# Patient Record
Sex: Female | Born: 1970 | Race: Black or African American | Hispanic: No | Marital: Married | Smoking: Never smoker
Health system: Southern US, Community
[De-identification: ages and names within clinical notes are randomized; demographics above are authoritative.]

## PROBLEM LIST (undated history)

## (undated) DIAGNOSIS — O139 Gestational [pregnancy-induced] hypertension without significant proteinuria, unspecified trimester: Secondary | ICD-10-CM

## (undated) DIAGNOSIS — D649 Anemia, unspecified: Secondary | ICD-10-CM

## (undated) DIAGNOSIS — N739 Female pelvic inflammatory disease, unspecified: Secondary | ICD-10-CM

## (undated) DIAGNOSIS — O24419 Gestational diabetes mellitus in pregnancy, unspecified control: Secondary | ICD-10-CM

## (undated) HISTORY — PX: NO PAST SURGERIES: SHX2092

## (undated) HISTORY — DX: Anemia, unspecified: D64.9

## (undated) HISTORY — PX: ABDOMINAL HYSTERECTOMY: SHX81

---

## 2001-04-13 ENCOUNTER — Other Ambulatory Visit: Admission: RE | Admit: 2001-04-13 | Discharge: 2001-04-13 | Payer: Self-pay | Admitting: Obstetrics and Gynecology

## 2001-07-30 ENCOUNTER — Inpatient Hospital Stay (HOSPITAL_COMMUNITY): Admission: AD | Admit: 2001-07-30 | Discharge: 2001-07-30 | Payer: Self-pay | Admitting: Obstetrics and Gynecology

## 2001-11-14 ENCOUNTER — Inpatient Hospital Stay (HOSPITAL_COMMUNITY): Admission: AD | Admit: 2001-11-14 | Discharge: 2001-11-16 | Payer: Self-pay | Admitting: Obstetrics and Gynecology

## 2004-06-07 ENCOUNTER — Inpatient Hospital Stay (HOSPITAL_COMMUNITY): Admission: AD | Admit: 2004-06-07 | Discharge: 2004-06-07 | Payer: Self-pay | Admitting: Obstetrics & Gynecology

## 2004-10-12 ENCOUNTER — Inpatient Hospital Stay (HOSPITAL_COMMUNITY): Admission: AD | Admit: 2004-10-12 | Discharge: 2004-10-14 | Payer: Self-pay | Admitting: Obstetrics & Gynecology

## 2006-03-01 ENCOUNTER — Emergency Department (HOSPITAL_COMMUNITY): Admission: EM | Admit: 2006-03-01 | Discharge: 2006-03-01 | Payer: Self-pay | Admitting: Emergency Medicine

## 2006-12-08 ENCOUNTER — Ambulatory Visit: Payer: Self-pay | Admitting: Internal Medicine

## 2006-12-09 ENCOUNTER — Ambulatory Visit: Payer: Self-pay | Admitting: *Deleted

## 2007-11-16 ENCOUNTER — Ambulatory Visit: Payer: Self-pay | Admitting: Nurse Practitioner

## 2007-11-17 ENCOUNTER — Encounter (INDEPENDENT_AMBULATORY_CARE_PROVIDER_SITE_OTHER): Payer: Self-pay | Admitting: Nurse Practitioner

## 2007-11-17 ENCOUNTER — Ambulatory Visit (HOSPITAL_COMMUNITY): Admission: RE | Admit: 2007-11-17 | Discharge: 2007-11-17 | Payer: Self-pay | Admitting: Internal Medicine

## 2007-11-17 LAB — CONVERTED CEMR LAB
ALT: 10 units/L (ref 0–35)
AST: 17 units/L (ref 0–37)
Albumin: 4 g/dL (ref 3.5–5.2)
Alkaline Phosphatase: 78 units/L (ref 39–117)
Basophils Absolute: 0 10*3/uL (ref 0.0–0.1)
Basophils Relative: 0 % (ref 0–1)
Eosinophils Absolute: 0 10*3/uL (ref 0.0–0.7)
Eosinophils Relative: 0 % (ref 0–5)
Glucose, Bld: 88 mg/dL (ref 70–99)
HCT: 26.6 % — ABNORMAL LOW (ref 36.0–46.0)
Lymphs Abs: 1.7 10*3/uL (ref 0.7–4.0)
MCV: 68 fL — ABNORMAL LOW (ref 78.0–100.0)
Neutrophils Relative %: 66 % (ref 43–77)
Platelets: 293 10*3/uL (ref 150–400)
Potassium: 3.8 meq/L (ref 3.5–5.3)
RDW: 20.5 % — ABNORMAL HIGH (ref 11.5–15.5)
Sodium: 141 meq/L (ref 135–145)
Total Bilirubin: 0.4 mg/dL (ref 0.3–1.2)
Total Protein: 7.3 g/dL (ref 6.0–8.3)
WBC: 7 10*3/uL (ref 4.0–10.5)

## 2007-12-15 ENCOUNTER — Ambulatory Visit: Payer: Self-pay | Admitting: Nurse Practitioner

## 2007-12-15 DIAGNOSIS — K644 Residual hemorrhoidal skin tags: Secondary | ICD-10-CM | POA: Insufficient documentation

## 2007-12-15 LAB — CONVERTED CEMR LAB
Bilirubin Urine: NEGATIVE
KOH Prep: NEGATIVE
Nitrite: NEGATIVE
Protein, U semiquant: 30
Urobilinogen, UA: 0.2
WBC Urine, dipstick: NEGATIVE

## 2007-12-16 ENCOUNTER — Encounter (INDEPENDENT_AMBULATORY_CARE_PROVIDER_SITE_OTHER): Payer: Self-pay | Admitting: Family Medicine

## 2007-12-18 DIAGNOSIS — D649 Anemia, unspecified: Secondary | ICD-10-CM

## 2007-12-18 LAB — CONVERTED CEMR LAB
Albumin: 4 g/dL (ref 3.5–5.2)
Alkaline Phosphatase: 90 units/L (ref 39–117)
BUN: 11 mg/dL (ref 6–23)
Calcium: 8.8 mg/dL (ref 8.4–10.5)
Chloride: 105 meq/L (ref 96–112)
Creatinine, Ser: 0.61 mg/dL (ref 0.40–1.20)
Eosinophils Absolute: 0.1 10*3/uL (ref 0.0–0.7)
Glucose, Bld: 75 mg/dL (ref 70–99)
HDL: 60 mg/dL (ref >39)
Hemoglobin: 7 g/dL — CL (ref 12.0–15.0)
Lymphs Abs: 2.3 10*3/uL (ref 0.7–4.0)
MCHC: 27.2 g/dL — ABNORMAL LOW (ref 30.0–36.0)
MCV: 67.1 fL — ABNORMAL LOW (ref 78.0–100.0)
Monocytes Absolute: 0.3 10*3/uL (ref 0.1–1.0)
Monocytes Relative: 6 % (ref 3–12)
Neutrophils Relative %: 47 % (ref 43–77)
Potassium: 4 meq/L (ref 3.5–5.3)
RBC: 3.83 M/uL — ABNORMAL LOW (ref 3.87–5.11)
Total CHOL/HDL Ratio: 2.6
Triglycerides: 49 mg/dL (ref <150)
WBC: 5.2 10*3/uL (ref 4.0–10.5)

## 2007-12-19 ENCOUNTER — Encounter (INDEPENDENT_AMBULATORY_CARE_PROVIDER_SITE_OTHER): Payer: Self-pay | Admitting: Nurse Practitioner

## 2007-12-19 LAB — CONVERTED CEMR LAB
Iron: 30 ug/dL — ABNORMAL LOW (ref 42–145)
Saturation Ratios: 7 % — ABNORMAL LOW (ref 20–55)
UIBC: 410 ug/dL

## 2007-12-21 ENCOUNTER — Ambulatory Visit (HOSPITAL_COMMUNITY): Admission: RE | Admit: 2007-12-21 | Discharge: 2007-12-21 | Payer: Self-pay | Admitting: Internal Medicine

## 2007-12-21 DIAGNOSIS — N83209 Unspecified ovarian cyst, unspecified side: Secondary | ICD-10-CM

## 2008-01-09 ENCOUNTER — Ambulatory Visit: Payer: Self-pay | Admitting: Nurse Practitioner

## 2008-01-10 ENCOUNTER — Encounter (HOSPITAL_COMMUNITY): Admission: RE | Admit: 2008-01-10 | Discharge: 2008-04-09 | Payer: Self-pay | Admitting: Family Medicine

## 2008-01-10 ENCOUNTER — Encounter (INDEPENDENT_AMBULATORY_CARE_PROVIDER_SITE_OTHER): Payer: Self-pay | Admitting: Nurse Practitioner

## 2008-01-10 LAB — CONVERTED CEMR LAB
Basophils Absolute: 0 10*3/uL (ref 0.0–0.1)
Basophils Relative: 1 % (ref 0–1)
CA 125: 14.3 units/mL (ref 0.0–30.2)
Eosinophils Absolute: 0.1 10*3/uL (ref 0.0–0.7)
MCHC: 27.7 g/dL — ABNORMAL LOW (ref 30.0–36.0)
MCV: 67.2 fL — ABNORMAL LOW (ref 78.0–100.0)
Monocytes Relative: 8 % (ref 3–12)
Neutrophils Relative %: 45 % (ref 43–77)
Platelets: 328 10*3/uL (ref 150–400)
RDW: 20.5 % — ABNORMAL HIGH (ref 11.5–15.5)

## 2008-01-23 ENCOUNTER — Ambulatory Visit: Payer: Self-pay | Admitting: Nurse Practitioner

## 2008-01-24 ENCOUNTER — Encounter (INDEPENDENT_AMBULATORY_CARE_PROVIDER_SITE_OTHER): Payer: Self-pay | Admitting: Nurse Practitioner

## 2008-01-24 LAB — CONVERTED CEMR LAB
Eosinophils Absolute: 0.1 10*3/uL (ref 0.0–0.7)
Iron: 57 ug/dL (ref 42–145)
Lymphs Abs: 1.8 10*3/uL (ref 0.7–4.0)
MCV: 77.9 fL — ABNORMAL LOW (ref 78.0–100.0)
Neutro Abs: 4.1 10*3/uL (ref 1.7–7.7)
Neutrophils Relative %: 62 % (ref 43–77)
Platelets: 219 10*3/uL (ref 150–400)
WBC: 6.6 10*3/uL (ref 4.0–10.5)

## 2008-03-08 ENCOUNTER — Ambulatory Visit: Payer: Self-pay | Admitting: Nurse Practitioner

## 2008-03-26 ENCOUNTER — Ambulatory Visit (HOSPITAL_COMMUNITY): Admission: RE | Admit: 2008-03-26 | Discharge: 2008-03-26 | Payer: Self-pay | Admitting: Internal Medicine

## 2008-03-27 DIAGNOSIS — N7013 Chronic salpingitis and oophoritis: Secondary | ICD-10-CM | POA: Insufficient documentation

## 2008-04-25 ENCOUNTER — Ambulatory Visit: Payer: Self-pay | Admitting: Nurse Practitioner

## 2008-04-25 DIAGNOSIS — K5900 Constipation, unspecified: Secondary | ICD-10-CM | POA: Insufficient documentation

## 2008-04-25 LAB — CONVERTED CEMR LAB
HCT: 39.4 % (ref 36.0–46.0)
Platelets: 264 10*3/uL (ref 150–400)
Retic Ct Pct: 0.8 % (ref 0.4–3.1)
WBC: 4.6 10*3/uL (ref 4.0–10.5)

## 2008-04-26 ENCOUNTER — Encounter (INDEPENDENT_AMBULATORY_CARE_PROVIDER_SITE_OTHER): Payer: Self-pay | Admitting: Nurse Practitioner

## 2008-06-14 ENCOUNTER — Ambulatory Visit: Payer: Self-pay | Admitting: Internal Medicine

## 2008-06-14 DIAGNOSIS — J069 Acute upper respiratory infection, unspecified: Secondary | ICD-10-CM | POA: Insufficient documentation

## 2008-10-28 ENCOUNTER — Ambulatory Visit: Payer: Self-pay | Admitting: Nurse Practitioner

## 2008-10-28 DIAGNOSIS — J329 Chronic sinusitis, unspecified: Secondary | ICD-10-CM | POA: Insufficient documentation

## 2008-10-28 LAB — CONVERTED CEMR LAB
HCT: 39.1 % (ref 36.0–46.0)
Lymphocytes Relative: 34 % (ref 12–46)
Lymphs Abs: 2.5 10*3/uL (ref 0.7–4.0)
Neutrophils Relative %: 56 % (ref 43–77)
Platelets: 246 10*3/uL (ref 150–400)
Preg, Serum: POSITIVE
Rapid Strep: NEGATIVE
WBC: 7.3 10*3/uL (ref 4.0–10.5)

## 2008-10-29 ENCOUNTER — Encounter (INDEPENDENT_AMBULATORY_CARE_PROVIDER_SITE_OTHER): Payer: Self-pay | Admitting: Nurse Practitioner

## 2008-10-29 LAB — CONVERTED CEMR LAB: hCG, Beta Chain, Quant, S: 34622.9 milliintl units/mL

## 2008-11-28 ENCOUNTER — Emergency Department (HOSPITAL_COMMUNITY): Admission: EM | Admit: 2008-11-28 | Discharge: 2008-11-28 | Payer: Self-pay | Admitting: Emergency Medicine

## 2009-01-23 ENCOUNTER — Inpatient Hospital Stay (HOSPITAL_COMMUNITY): Admission: AD | Admit: 2009-01-23 | Discharge: 2009-01-23 | Payer: Self-pay | Admitting: Obstetrics & Gynecology

## 2009-04-15 ENCOUNTER — Encounter: Admission: RE | Admit: 2009-04-15 | Discharge: 2009-05-30 | Payer: Self-pay | Admitting: Obstetrics

## 2009-05-22 ENCOUNTER — Ambulatory Visit (HOSPITAL_COMMUNITY): Admission: RE | Admit: 2009-05-22 | Discharge: 2009-05-22 | Payer: Self-pay | Admitting: Obstetrics

## 2009-05-26 ENCOUNTER — Ambulatory Visit (HOSPITAL_COMMUNITY): Admission: RE | Admit: 2009-05-26 | Discharge: 2009-05-26 | Payer: Self-pay | Admitting: Obstetrics

## 2009-05-29 ENCOUNTER — Ambulatory Visit (HOSPITAL_COMMUNITY): Admission: RE | Admit: 2009-05-29 | Discharge: 2009-05-29 | Payer: Self-pay | Admitting: Obstetrics

## 2009-06-02 ENCOUNTER — Ambulatory Visit (HOSPITAL_COMMUNITY): Admission: RE | Admit: 2009-06-02 | Discharge: 2009-06-02 | Payer: Self-pay | Admitting: Obstetrics

## 2009-06-05 ENCOUNTER — Ambulatory Visit (HOSPITAL_COMMUNITY): Admission: RE | Admit: 2009-06-05 | Discharge: 2009-06-05 | Payer: Self-pay | Admitting: Obstetrics

## 2009-06-09 ENCOUNTER — Ambulatory Visit (HOSPITAL_COMMUNITY): Admission: RE | Admit: 2009-06-09 | Discharge: 2009-06-09 | Payer: Self-pay | Admitting: Obstetrics

## 2009-06-11 ENCOUNTER — Inpatient Hospital Stay (HOSPITAL_COMMUNITY): Admission: AD | Admit: 2009-06-11 | Discharge: 2009-06-13 | Payer: Self-pay | Admitting: Obstetrics & Gynecology

## 2009-06-13 ENCOUNTER — Inpatient Hospital Stay (HOSPITAL_COMMUNITY): Admission: AD | Admit: 2009-06-13 | Discharge: 2009-06-13 | Payer: Self-pay | Admitting: Obstetrics

## 2009-06-17 ENCOUNTER — Emergency Department (HOSPITAL_COMMUNITY): Admission: EM | Admit: 2009-06-17 | Discharge: 2009-06-17 | Payer: Self-pay | Admitting: Emergency Medicine

## 2009-06-19 ENCOUNTER — Emergency Department (HOSPITAL_COMMUNITY): Admission: EM | Admit: 2009-06-19 | Discharge: 2009-06-19 | Payer: Self-pay | Admitting: Emergency Medicine

## 2009-09-12 IMAGING — US US PELVIS COMPLETE MODIFY
1 series · 13 of 25 positions shown · non-contrast
Comparison: Correlation made with CT pelvis 11/17/2007

CLINICAL DATA: Abnormal CT pelvis with right adnexal mass, question
uterine fibroids

TRANSABDOMINAL AND TRANSVAGINAL ULTRASOUND OF PELVIS
TECHNIQUE: Both transabdominal and transvaginal ultrasound
examinations of the pelvis were performed including evaluation of
the uterus, ovaries, adnexal regions, and pelvic cul-de-sac.

[Series 1: unknown · 0.28mm/px · 13 of 60 slices shown]
[im 1/60]
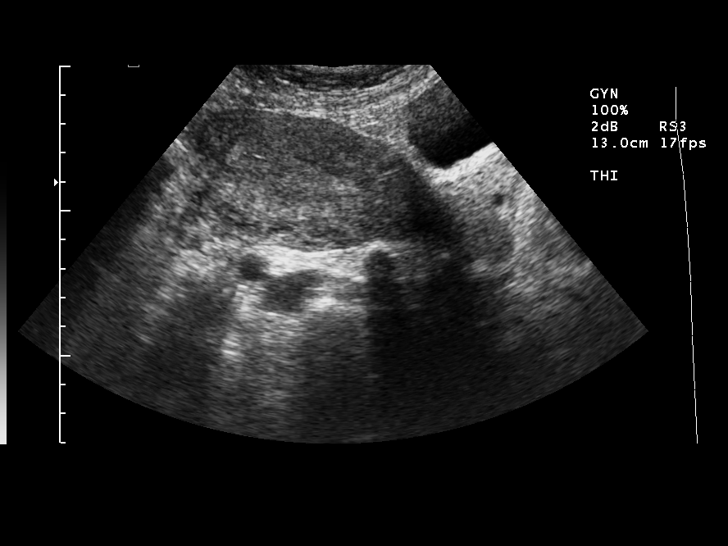
[im 5/60]
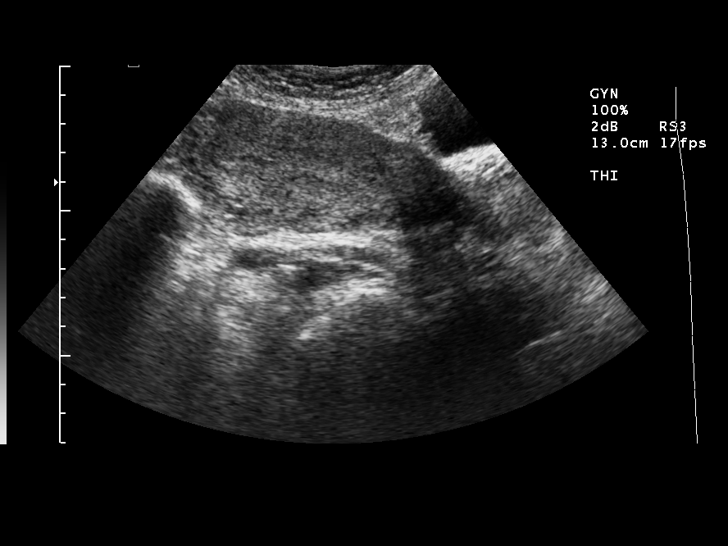
[im 10/60]
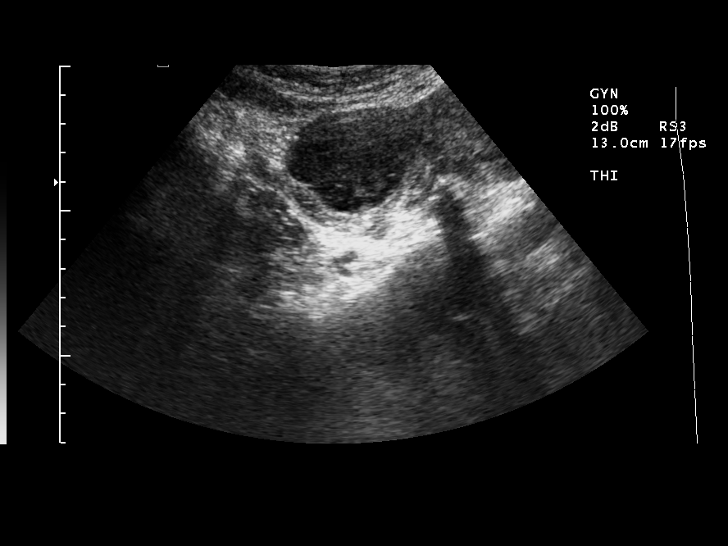
[im 15/60]
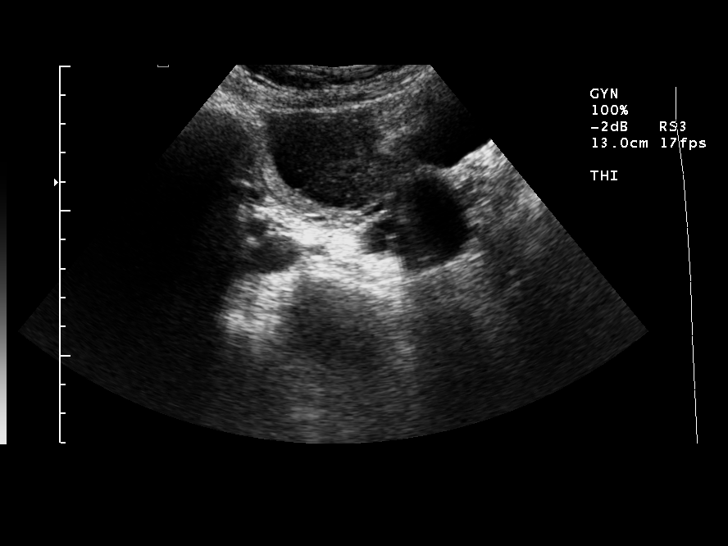
[im 20/60]
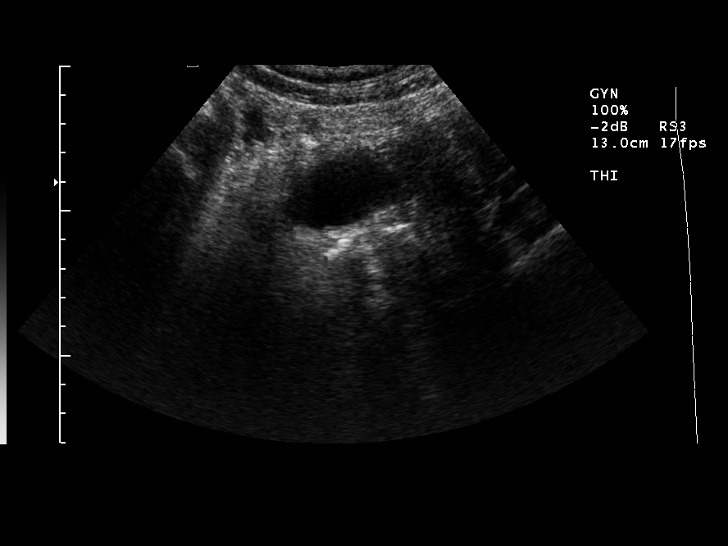
[im 25/60]
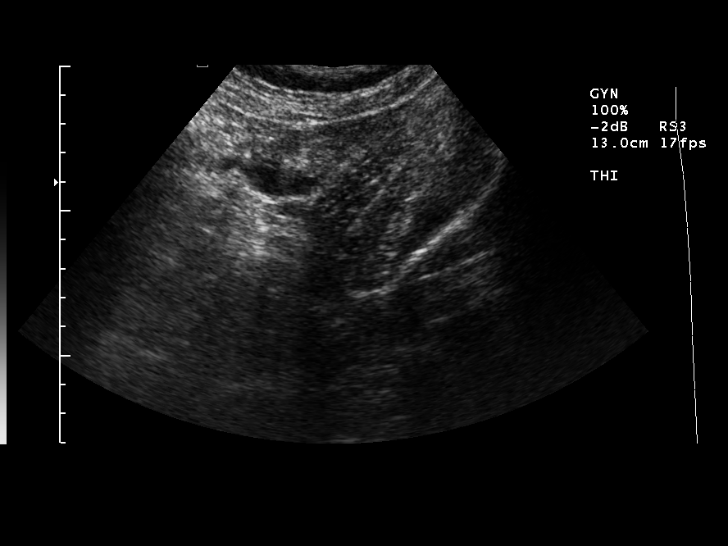
[im 30/60]
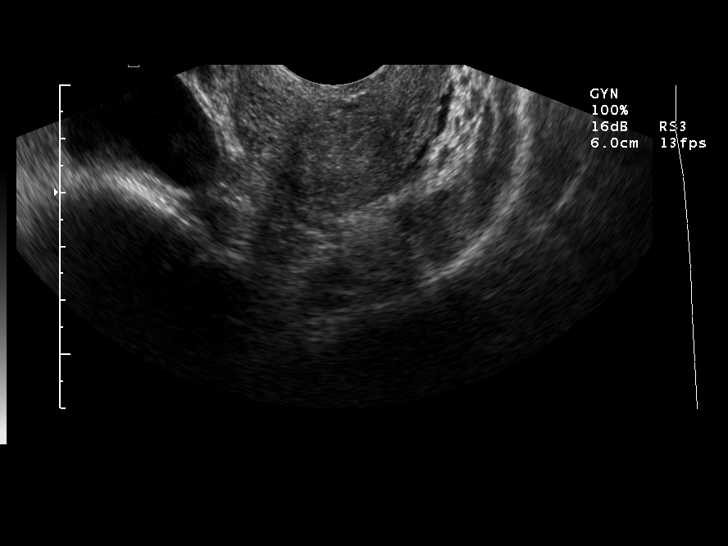
[im 35/60]
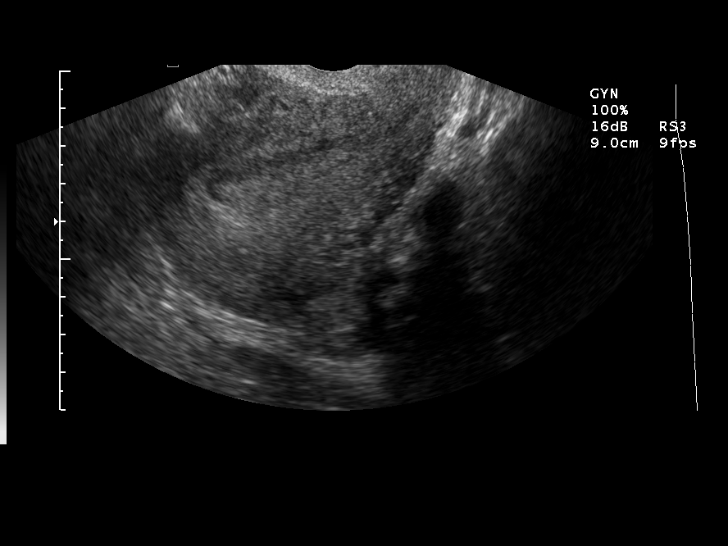
[im 40/60]
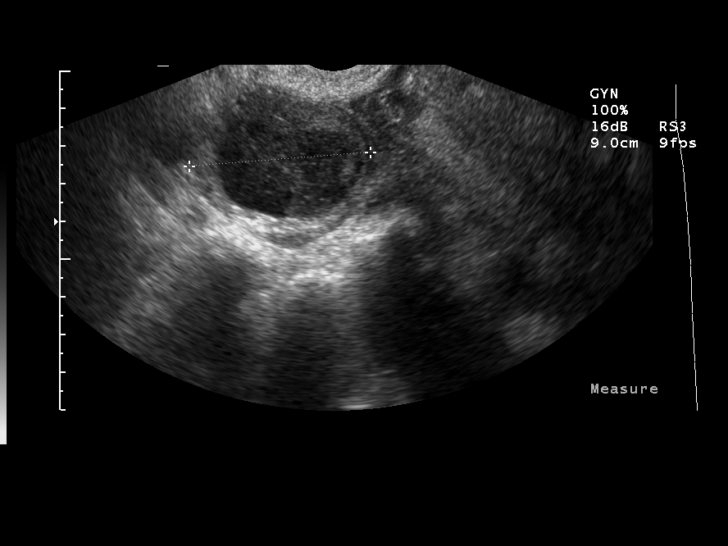
[im 45/60]
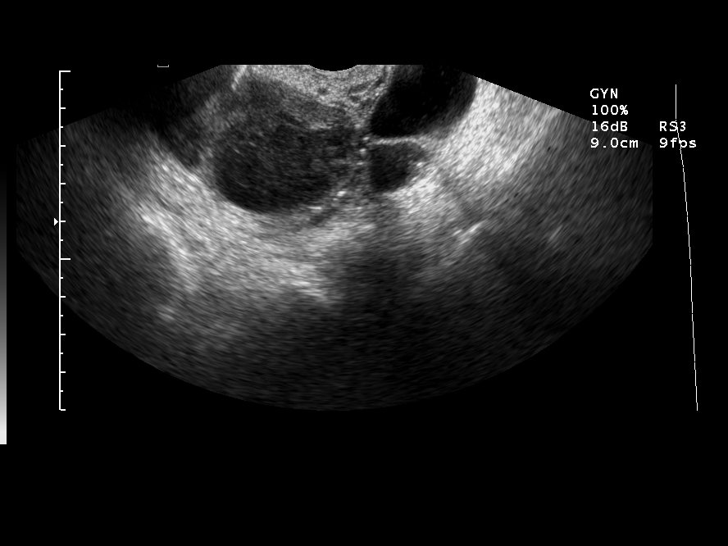
[im 50/60]
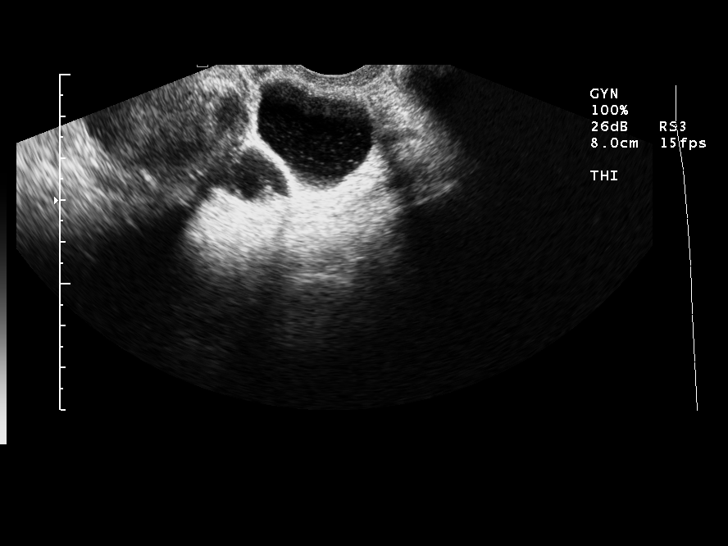
[im 55/60]
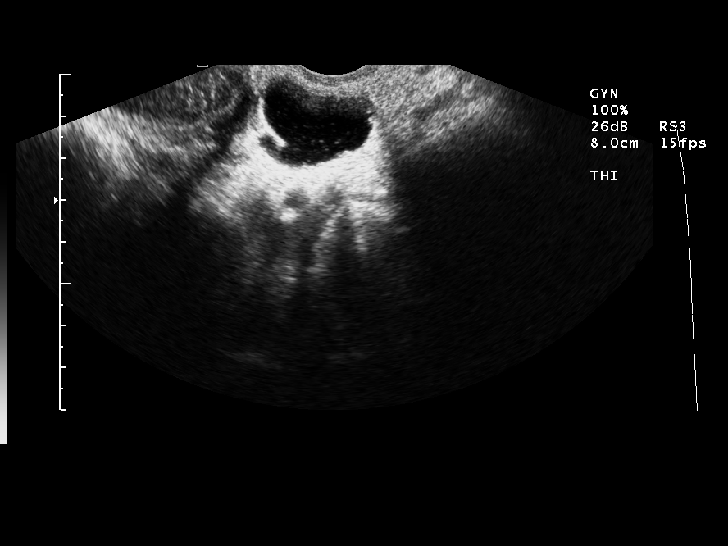
[im 60/60]
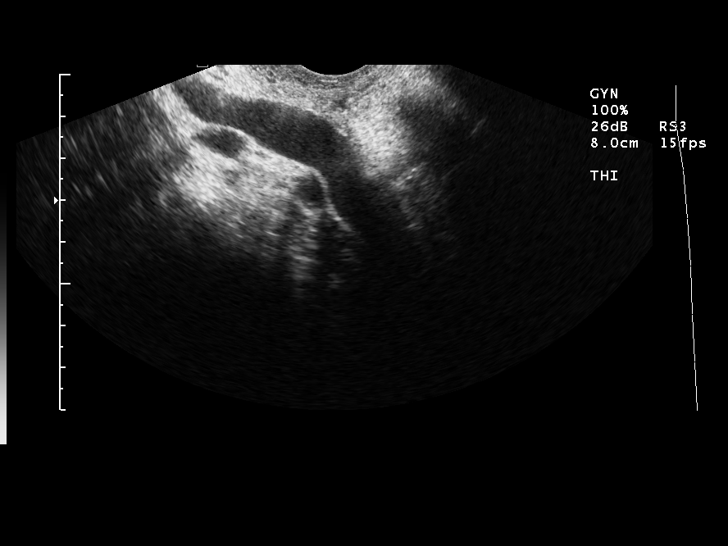

[13 of 25 positions shown; findings below may reference images not displayed]

FINDINGS: Uterus measures 10.1 cm length by 5.9 cm AP by 7.5 cm transverse.
Endometrial complex 12 mm thick, minimally prominent.
No definite uterine mass or endometrial fluid.
No free pelvic fluid.
Left ovary not visualized on either transabdominal nor endovaginal
imaging.
Tubular fluid collection containing minimal debris right adnexa
compatible with hydrosalpinx.
Right ovary measures 4.3 x 4.2 x 4.8 cm.
Within right ovary, separate from segment of hydrosalpinx and
different in imaging character, a hypoechoic nodule is identified
3.6 x 2.5 x 3.7 cm, containing heterogeneous low level
echogenicity, question hemorrhagic cyst.
No other pelvic masses identified.
IMPRESSION: Minimally prominent endometrial complex, nonspecific.
Nonvisualization left ovary.
Short segment of hydrosalpinx in right adnexa.
3.6 x 2.5 x 3.7 cm diameter complex hypoechoic mass right ovary
question hemorrhagic cyst, recommend follow-up ultrasound after two
menstrual cycles to reassess, in order to exclude ovarian tumor.

## 2010-10-05 ENCOUNTER — Encounter: Payer: Self-pay | Admitting: Obstetrics

## 2010-12-18 LAB — RAPID HIV SCREEN (WH-MAU): Rapid HIV Screen: NONREACTIVE

## 2010-12-18 LAB — CBC
HCT: 30.9 % — ABNORMAL LOW (ref 36.0–46.0)
MCHC: 32.8 g/dL (ref 30.0–36.0)
MCV: 91.4 fL (ref 78.0–100.0)
Platelets: 184 10*3/uL (ref 150–400)
RBC: 4.11 MIL/uL (ref 3.87–5.11)
RDW: 15.1 % (ref 11.5–15.5)
WBC: 6.9 10*3/uL (ref 4.0–10.5)

## 2010-12-18 LAB — GLUCOSE, RANDOM: Glucose, Bld: 112 mg/dL — ABNORMAL HIGH (ref 70–99)

## 2010-12-18 LAB — RPR: RPR Ser Ql: NONREACTIVE

## 2010-12-22 LAB — DIFFERENTIAL
Basophils Absolute: 0.1 10*3/uL (ref 0.0–0.1)
Basophils Relative: 1 % (ref 0–1)
Eosinophils Absolute: 0 10*3/uL (ref 0.0–0.7)
Neutro Abs: 5 10*3/uL (ref 1.7–7.7)
Neutrophils Relative %: 64 % (ref 43–77)

## 2010-12-22 LAB — CBC
MCHC: 33.8 g/dL (ref 30.0–36.0)
MCV: 89.6 fL (ref 78.0–100.0)
Platelets: 199 10*3/uL (ref 150–400)
RDW: 15.4 % (ref 11.5–15.5)

## 2010-12-22 LAB — URINALYSIS, ROUTINE W REFLEX MICROSCOPIC
Bilirubin Urine: NEGATIVE
Hgb urine dipstick: NEGATIVE
Ketones, ur: NEGATIVE mg/dL
Nitrite: NEGATIVE
Protein, ur: NEGATIVE mg/dL
Specific Gravity, Urine: 1.01 (ref 1.005–1.030)
Urobilinogen, UA: 0.2 mg/dL (ref 0.0–1.0)

## 2010-12-24 LAB — URINALYSIS, ROUTINE W REFLEX MICROSCOPIC
Hgb urine dipstick: NEGATIVE
Protein, ur: NEGATIVE mg/dL
Specific Gravity, Urine: 1.019 (ref 1.005–1.030)
Urobilinogen, UA: 1 mg/dL (ref 0.0–1.0)

## 2010-12-24 LAB — CBC
HCT: 38.2 % (ref 36.0–46.0)
Hemoglobin: 12.6 g/dL (ref 12.0–15.0)
MCHC: 33.1 g/dL (ref 30.0–36.0)
RBC: 4.32 MIL/uL (ref 3.87–5.11)
RDW: 16.5 % — ABNORMAL HIGH (ref 11.5–15.5)

## 2010-12-24 LAB — POCT I-STAT, CHEM 8
BUN: 6 mg/dL (ref 6–23)
Creatinine, Ser: 0.7 mg/dL (ref 0.4–1.2)
Glucose, Bld: 99 mg/dL (ref 70–99)
Hemoglobin: 13.9 g/dL (ref 12.0–15.0)
Potassium: 3.7 mEq/L (ref 3.5–5.1)
Sodium: 136 mEq/L (ref 135–145)
TCO2: 23 mmol/L (ref 0–100)

## 2010-12-24 LAB — URINE CULTURE

## 2010-12-24 LAB — GC/CHLAMYDIA PROBE AMP, GENITAL
Chlamydia, DNA Probe: NEGATIVE
GC Probe Amp, Genital: NEGATIVE

## 2010-12-24 LAB — POCT CARDIAC MARKERS
CKMB, poc: <1 ng/mL — ABNORMAL LOW (ref 1.0–8.0)
Myoglobin, poc: 36.7 ng/mL (ref 12–200)

## 2011-06-08 LAB — CROSSMATCH

## 2011-06-08 LAB — ABO/RH: ABO/RH(D): A POS

## 2013-07-17 ENCOUNTER — Observation Stay (HOSPITAL_COMMUNITY): Payer: No Typology Code available for payment source

## 2013-07-17 ENCOUNTER — Emergency Department (HOSPITAL_COMMUNITY): Payer: No Typology Code available for payment source

## 2013-07-17 ENCOUNTER — Encounter (HOSPITAL_COMMUNITY): Payer: Self-pay | Admitting: Emergency Medicine

## 2013-07-17 ENCOUNTER — Observation Stay (HOSPITAL_COMMUNITY)
Admission: EM | Admit: 2013-07-17 | Discharge: 2013-07-19 | Disposition: A | Discharge status: Home / Self Care | Payer: No Typology Code available for payment source | Attending: Internal Medicine | Admitting: Internal Medicine

## 2013-07-17 DIAGNOSIS — R0602 Shortness of breath: Secondary | ICD-10-CM | POA: Insufficient documentation

## 2013-07-17 DIAGNOSIS — N83209 Unspecified ovarian cyst, unspecified side: Secondary | ICD-10-CM

## 2013-07-17 DIAGNOSIS — N7013 Chronic salpingitis and oophoritis: Secondary | ICD-10-CM

## 2013-07-17 DIAGNOSIS — R0989 Other specified symptoms and signs involving the circulatory and respiratory systems: Principal | ICD-10-CM | POA: Insufficient documentation

## 2013-07-17 DIAGNOSIS — L301 Dyshidrosis [pompholyx]: Secondary | ICD-10-CM | POA: Insufficient documentation

## 2013-07-17 DIAGNOSIS — R0789 Other chest pain: Secondary | ICD-10-CM | POA: Insufficient documentation

## 2013-07-17 DIAGNOSIS — Z3202 Encounter for pregnancy test, result negative: Secondary | ICD-10-CM | POA: Insufficient documentation

## 2013-07-17 DIAGNOSIS — R079 Chest pain, unspecified: Secondary | ICD-10-CM | POA: Diagnosis present

## 2013-07-17 DIAGNOSIS — D649 Anemia, unspecified: Secondary | ICD-10-CM | POA: Diagnosis present

## 2013-07-17 DIAGNOSIS — K644 Residual hemorrhoidal skin tags: Secondary | ICD-10-CM

## 2013-07-17 DIAGNOSIS — J329 Chronic sinusitis, unspecified: Secondary | ICD-10-CM

## 2013-07-17 DIAGNOSIS — K5900 Constipation, unspecified: Secondary | ICD-10-CM

## 2013-07-17 DIAGNOSIS — J069 Acute upper respiratory infection, unspecified: Secondary | ICD-10-CM

## 2013-07-17 DIAGNOSIS — R0609 Other forms of dyspnea: Principal | ICD-10-CM | POA: Insufficient documentation

## 2013-07-17 DIAGNOSIS — R51 Headache: Secondary | ICD-10-CM | POA: Insufficient documentation

## 2013-07-17 LAB — CBC
HCT: 28.1 % — ABNORMAL LOW (ref 36.0–46.0)
Hemoglobin: 8.5 g/dL — ABNORMAL LOW (ref 12.0–15.0)
MCV: 68.9 fL — ABNORMAL LOW (ref 78.0–100.0)
RBC: 4.08 MIL/uL (ref 3.87–5.11)
RDW: 18.8 % — ABNORMAL HIGH (ref 11.5–15.5)
WBC: 6.8 10*3/uL (ref 4.0–10.5)

## 2013-07-17 LAB — POCT I-STAT TROPONIN I

## 2013-07-17 LAB — URINE MICROSCOPIC-ADD ON

## 2013-07-17 LAB — TYPE AND SCREEN
ABO/RH(D): A POS
Antibody Screen: NEGATIVE

## 2013-07-17 LAB — BASIC METABOLIC PANEL
BUN: 13 mg/dL (ref 6–23)
CO2: 22 mEq/L (ref 19–32)
Chloride: 106 mEq/L (ref 96–112)
GFR calc Af Amer: >90 mL/min (ref >90)
Potassium: 3.7 mEq/L (ref 3.5–5.1)

## 2013-07-17 LAB — URINALYSIS, ROUTINE W REFLEX MICROSCOPIC
Bilirubin Urine: NEGATIVE
Glucose, UA: NEGATIVE mg/dL
Leukocytes, UA: NEGATIVE
Nitrite: NEGATIVE
Specific Gravity, Urine: 1.028 (ref 1.005–1.030)
pH: 7 (ref 5.0–8.0)

## 2013-07-17 LAB — POCT PREGNANCY, URINE: Preg Test, Ur: NEGATIVE

## 2013-07-17 MED ORDER — ACETAMINOPHEN 325 MG PO TABS: 650.0000 mg | ORAL_TABLET | Freq: Four times a day (QID) | ORAL | Status: DC | PRN

## 2013-07-17 MED ORDER — KETOROLAC TROMETHAMINE 30 MG/ML IJ SOLN
30.0000 mg | Freq: Once | INTRAMUSCULAR | Status: AC
  Administered 2013-07-17: 30 mg via INTRAVENOUS
  Filled 2013-07-17: qty 1

## 2013-07-17 MED ORDER — METOCLOPRAMIDE HCL 5 MG/ML IJ SOLN
10.0000 mg | Freq: Once | INTRAMUSCULAR | Status: AC
  Administered 2013-07-17: 10 mg via INTRAVENOUS
  Filled 2013-07-17: qty 2

## 2013-07-17 MED ORDER — SODIUM CHLORIDE 0.9 % IJ SOLN
3.0000 mL | Freq: Two times a day (BID) | INTRAMUSCULAR | Status: DC
  Administered 2013-07-18: 3 mL via INTRAVENOUS

## 2013-07-17 MED ORDER — SODIUM CHLORIDE 0.9 % IJ SOLN
3.0000 mL | Freq: Two times a day (BID) | INTRAMUSCULAR | Status: DC
  Administered 2013-07-18 – 2013-07-19 (×2): 3 mL via INTRAVENOUS

## 2013-07-17 MED ORDER — ONDANSETRON HCL 4 MG PO TABS: 4.0000 mg | ORAL_TABLET | Freq: Four times a day (QID) | ORAL | Status: DC | PRN

## 2013-07-17 MED ORDER — ONDANSETRON HCL 4 MG/2ML IJ SOLN: 4.0000 mg | Freq: Three times a day (TID) | INTRAMUSCULAR | Status: DC | PRN

## 2013-07-17 MED ORDER — ACETAMINOPHEN 650 MG RE SUPP: 650.0000 mg | Freq: Four times a day (QID) | RECTAL | Status: DC | PRN

## 2013-07-17 MED ORDER — ONDANSETRON HCL 4 MG/2ML IJ SOLN: 4.0000 mg | Freq: Four times a day (QID) | INTRAMUSCULAR | Status: DC | PRN

## 2013-07-17 NOTE — ED Notes (Signed)
Family at bedside. 

## 2013-07-17 NOTE — ED Notes (Signed)
Report to [place] on 3W.  [facility] RN to take pt to CT then take patient to the floor.

## 2013-07-17 NOTE — ED Notes (Addendum)
Pt is here for body aches for 3 weeks.  Pt states that she has body aches and Headaches.  She states that it feels like "someone beat me" on her whole body.  Pt states that she has had some dizziness and that she feels tired and sob and CP with any activity.  Pt denies any cough or cold.  Pt feels like she has fever and sweats at night.  Pt denies any coughing up of blood or weight loss.  No one has been sick around her

## 2013-07-17 NOTE — ED Notes (Signed)
Pt moved to room 28 to await floor/room assignment. Pt alert, denies pain.  Placed on monitor, SR without ectopy.  Pt denies needs at this time.

## 2013-07-17 NOTE — ED Provider Notes (Signed)
CSN: [id number]     Arrival date & time 07/17/13  1742 History   First MD Initiated Contact with Patient 07/17/13 1923     Chief Complaint  Patient presents with  . Illness   (Consider location/radiation/quality/duration/timing/severity/associated sxs/prior Treatment) HPI Comments: Pt comes in with cc of body aches, headaches, chest pain, dib and weakness. Her sx have been present for 3 weeks now. She has no medical hx. Reports that she has frontal headaches, mostly constant and moderate, with intermittent relief with tylenol. No nausea, vomiting, visual complains, seizures, altered mental status, loss of consciousness, new weakness, or numbness, no gait instability. Pt also has been having some chest pain - mid sternal, non exerting which is worse with exertion and with associated dyspnea. Pt has been having some night sweats, no weight loss. She is from [place], not been there is a while, and denies any cough.   Patient is a 42 y.o. female presenting with general illness. The history is provided by the patient.  Illness Associated symptoms: fatigue, headaches and shortness of breath   Associated symptoms: no abdominal pain, no chest pain, no cough, no diarrhea, no nausea, no vomiting and no wheezing     History reviewed. No pertinent past medical history. History reviewed. No pertinent past surgical history. Family History  Problem Relation Age of Onset  . Other Neg Hx    History  Substance Use Topics  . Smoking status: Never Smoker   . Smokeless tobacco: Not on file  . Alcohol Use: No   OB History   Grav Para Term Preterm Abortions TAB SAB Ect Mult Living                 Review of Systems  Constitutional: Positive for diaphoresis and fatigue. Negative for activity change.  HENT: Negative for facial swelling.   Respiratory: Positive for chest tightness and shortness of breath. Negative for cough and wheezing.   Cardiovascular: Negative for chest pain.  Gastrointestinal:  Negative for nausea, vomiting, abdominal pain, diarrhea, constipation, blood in stool and abdominal distention.  Genitourinary: Negative for hematuria and difficulty urinating.  Musculoskeletal: Negative for neck pain.  Skin: Negative for color change.  Neurological: Positive for headaches. Negative for speech difficulty.  Hematological: Does not bruise/bleed easily.  Psychiatric/Behavioral: Negative for confusion.    Allergies  Review of patient's allergies indicates no known allergies.  Home Medications   Current Outpatient Rx  Name  Route  Sig  Dispense  Refill  . acetaminophen (TYLENOL) 500 MG tablet   Oral   Take 500 mg by mouth every 6 (six) hours as needed.          BP 148/88  Pulse 77  Temp(Src) 98.2 F (36.8 C) (Rectal)  Resp 24  Ht 5\' 7"  (1.702 m)  Wt 207 lb (93.895 kg)  BMI 32.41 kg/m2  SpO2 99%  LMP 07/17/2013 Physical Exam  Constitutional: She is oriented to person, place, and time. She appears well-developed and well-nourished.  HENT:  Head: Normocephalic and atraumatic.  Eyes: EOM are normal. Pupils are equal, round, and reactive to light.  Neck: Neck supple.  Cardiovascular: Normal rate, regular rhythm and normal heart sounds.   No murmur heard. Pulmonary/Chest: Effort normal. No respiratory distress. She has no wheezes. She has no rales.  Abdominal: Soft. She exhibits no distension. There is no tenderness. There is no rebound and no guarding.  Neurological: She is alert and oriented to person, place, and time. No cranial nerve deficit. Coordination normal.  Skin: Skin is warm and dry.    ED Course  Procedures (including critical care time) Labs Review Labs Reviewed  CBC - Abnormal; Notable for the following:    Hemoglobin 8.5 (*)    HCT 28.1 (*)    MCV 68.9 (*)    MCH 20.8 (*)    RDW 18.8 (*)    All other components within normal limits  URINALYSIS, ROUTINE W REFLEX MICROSCOPIC - Abnormal; Notable for the following:    APPearance CLOUDY (*)     Hgb urine dipstick MODERATE (*)    All other components within normal limits  URINE MICROSCOPIC-ADD ON - Abnormal; Notable for the following:    Squamous Epithelial / LPF FEW (*)    Bacteria, UA FEW (*)    All other components within normal limits  BASIC METABOLIC PANEL  POCT I-STAT TROPONIN I  POCT PREGNANCY, URINE  TYPE AND SCREEN   Imaging Review Dg Chest 2 View  07/17/2013   CLINICAL DATA:  Chest pain and fever  EXAM: CHEST  2 VIEW  COMPARISON:  None.  FINDINGS: Lungs are clear. Heart size and pulmonary vascularity are normal. No adenopathy. No pneumothorax. 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  IMPRESSION: No edema or consolidation.   Electronically Signed   By: Bretta Bang M.D.   On: 07/17/2013 20:31    EKG Interpretation     Ventricular Rate:  65 PR Interval:  188 QRS Duration: 80 QT Interval:  414 QTC Calculation: 430 R Axis:   19 Text Interpretation:  Normal sinus rhythm with sinus arrhythmia Normal ECG            MDM   1. Dyspnea on exertion   2. Anemia    Pt comes in with multiple complains. The headaches are intermittent, and present for several weeks now. No concerns for any life threatening cause of the headaches, she has no neuro deficits and risk factors for aneurysms or bleed.  Pt has chest pain, and dib - both exertional. She has slightly low Hb - denies bloody stools. No cardiac risk factors at all. She is slightly tachypneic for me, RR in the low 20s. Her WELLS score is 0 and she is PERC neg.  i believe PE and ACS are highly unlikely, but with exertional dyspnea and chest pain - she needs further cardiac evaluation for sure, may be an echo to start.  Also, don't think this is symptomatic anemia. We will get T&S, and get serial Hb while admitted.  Not sure what the night sweats are all about. ? Paraneoplastic, She has no cough, weight loss.    Derwood Kaplan, MD 07/17/13 2316

## 2013-07-17 NOTE — H&P (Signed)
[facility] History and Physical  Loralee Weitzman ZOX:[id number] DOB: 06-14-1971 DOA: 07/17/2013  Referring physician: ER physician. PCP: No PCP Per Patient   Chief Complaint: Chest pain and short of breath.  HPI: Kristin [name] is a 42 y.o. female no significant past medical history presented to the ER because of chest pain and shortness of breath. Patient states that over the last 3-4 days patient has been having increasing exertional chest pain with shortness of breath. Patient states that with minimal exertion patient was easily getting short of breath. Denies any fever chills productive cough. Patient has been having frontal headache for last few weeks and has been taking Tylenol. Patient has chronic anemia and denies any GI bleed. In the ER cardiac markers and EKG were unremarkable and patient has been limited for further observation.   Review of Systems: As presented in the history of presenting illness, rest negative.  History reviewed. No pertinent past medical history. History reviewed. No pertinent past surgical history. Social History:  reports that she has never smoked. She does not have any smokeless tobacco history on file. She reports that she does not drink alcohol. Her drug history is not on file. Where does patient live  home.  Can patient participate in ADLs? yes.   No Known Allergies  Family History:  Family History  Problem Relation Age of Onset  . Other Neg Hx       Prior to Admission medications   Medication Sig Start Date End Date Taking? Authorizing Provider  acetaminophen (TYLENOL) 500 MG tablet Take 500 mg by mouth every 6 (six) hours as needed.   Yes Historical Provider, MD    Physical Exam: Filed Vitals:   07/17/13 2130 07/17/13 2133 07/17/13 2145 07/17/13 2200  BP: 157/96  136/75 148/88  Pulse: 75  67 77  Temp:  98.2 F (36.8 C)    TempSrc:  Rectal    Resp:      Height:      Weight:      SpO2: 100%  99% 99%     General:   Well-developed  well-nourished.  Eyes: Anicteric no pallor.  ENT:  No discharge from the ears eyes nose mouth.  Neck:  no mass felt.   Cardiovascular:  S1-S2 heard.   Respiratory:  no rhonchi or crepitations.  Abdomen:  Soft nontender bowel sounds present.  Skin:  no rash.   Musculoskeletal:  no edema.   Psychiatric:  appears normal.   Neurologic:  alert awake oriented to time place and person. Moves all extremities.  Labs on Admission:  Basic Metabolic Panel:  Recent Labs Lab 07/17/13 1823  NA 140  K 3.7  CL 106  CO2 22  GLUCOSE 95  BUN 13  CREATININE 0.69  CALCIUM 9.1   Liver Function Tests: No results found for this basename: AST, ALT, ALKPHOS, BILITOT, PROT, ALBUMIN,  in the last 168 hours No results found for this basename: LIPASE, AMYLASE,  in the last 168 hours No results found for this basename: AMMONIA,  in the last 168 hours CBC:  Recent Labs Lab 07/17/13 1823  WBC 6.8  HGB 8.5*  HCT 28.1*  MCV 68.9*  PLT 285   Cardiac Enzymes: No results found for this basename: CKTOTAL, CKMB, CKMBINDEX, TROPONINI,  in the last 168 hours  BNP (last 3 results) No results found for this basename: PROBNP,  in the last 8760 hours CBG: No results found for this basename: GLUCAP,  in the last 168 hours  Radiological  Exams on Admission: Dg Chest 2 View  07/17/2013   CLINICAL DATA:  Chest pain and fever  EXAM: CHEST  2 VIEW  COMPARISON:  None.  FINDINGS: Lungs are clear. Heart size and pulmonary vascularity are normal. No adenopathy. No pneumothorax. 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  IMPRESSION: No edema or consolidation.   Electronically Signed   By: Bretta Bang M.D.   On: 07/17/2013 20:31    EKG: Independently reviewed.  normal sinus rhythm.   Assessment/Plan Principal Problem:   Chest pain Active Problems:   ANEMIA   1. Shortness of breath and chest pain with exertion  - cycle cardiac markers. Check d-dimer. 2-D echo.  2. Anemia  - check stool for occult  blood. Anemia panel. Closely follow CBC.  3. Headache  - check CT head.     Code Status:  full code.   Family Communication:  none.   Disposition Plan:  admit for observation.     Berneta Sconyers N. [facility] Pager [phone].   If 7PM-7AM, please contact night-coverage [email or web address] Password [email or web address] 07/17/2013, 11:12 PM

## 2013-07-18 ENCOUNTER — Observation Stay (HOSPITAL_COMMUNITY): Payer: No Typology Code available for payment source

## 2013-07-18 ENCOUNTER — Encounter (HOSPITAL_COMMUNITY): Payer: Self-pay | Admitting: Radiology

## 2013-07-18 DIAGNOSIS — D649 Anemia, unspecified: Secondary | ICD-10-CM

## 2013-07-18 DIAGNOSIS — R079 Chest pain, unspecified: Secondary | ICD-10-CM

## 2013-07-18 LAB — BASIC METABOLIC PANEL
Chloride: 105 mEq/L (ref 96–112)
Creatinine, Ser: 0.66 mg/dL (ref 0.50–1.10)
GFR calc Af Amer: >90 mL/min (ref >90)
Potassium: 3.4 mEq/L — ABNORMAL LOW (ref 3.5–5.1)

## 2013-07-18 LAB — FOLATE: Folate: 11.4 ng/mL

## 2013-07-18 LAB — CBC
HCT: 29.1 % — ABNORMAL LOW (ref 36.0–46.0)
Hemoglobin: 8.7 g/dL — ABNORMAL LOW (ref 12.0–15.0)
MCV: 69.5 fL — ABNORMAL LOW (ref 78.0–100.0)
RBC: 4.19 MIL/uL (ref 3.87–5.11)
RDW: 19 % — ABNORMAL HIGH (ref 11.5–15.5)
WBC: 7.6 10*3/uL (ref 4.0–10.5)

## 2013-07-18 LAB — IRON AND TIBC
Iron: 14 ug/dL — ABNORMAL LOW (ref 42–135)
TIBC: 439 ug/dL (ref 250–470)

## 2013-07-18 LAB — TROPONIN I
Troponin I: <0.3 ng/mL (ref <0.30)
Troponin I: <0.3 ng/mL (ref <0.30)
Troponin I: <0.3 ng/mL (ref <0.30)

## 2013-07-18 LAB — RETICULOCYTES
RBC.: 4.16 MIL/uL (ref 3.87–5.11)
Retic Count, Absolute: 41.6 10*3/uL (ref 19.0–186.0)

## 2013-07-18 LAB — VITAMIN B12: Vitamin B-12: 383 pg/mL (ref 211–911)

## 2013-07-18 LAB — D-DIMER, QUANTITATIVE (NOT AT ARMC): D-Dimer, Quant: 1.21 ug/mL-FEU — ABNORMAL HIGH (ref 0.00–0.48)

## 2013-07-18 LAB — PRO B NATRIURETIC PEPTIDE: Pro B Natriuretic peptide (BNP): 24.7 pg/mL (ref 0–125)

## 2013-07-18 MED ORDER — IOHEXOL 350 MG/ML SOLN
100.0000 mL | Freq: Once | INTRAVENOUS | Status: AC | PRN
  Administered 2013-07-18: 100 mL via INTRAVENOUS

## 2013-07-18 MED ORDER — IOHEXOL 300 MG/ML  SOLN: 100.0000 mL | Freq: Once | INTRAMUSCULAR | Status: DC | PRN

## 2013-07-18 MED ORDER — FERROUS GLUCONATE 324 (38 FE) MG PO TABS: 324.0000 mg | ORAL_TABLET | Freq: Two times a day (BID) | ORAL | Status: DC

## 2013-07-18 MED ORDER — POTASSIUM CHLORIDE CRYS ER 20 MEQ PO TBCR
40.0000 meq | EXTENDED_RELEASE_TABLET | Freq: Once | ORAL | Status: AC
  Administered 2013-07-18: 40 meq via ORAL
  Filled 2013-07-18: qty 2

## 2013-07-18 MED ORDER — FERROUS GLUCONATE 324 (38 FE) MG PO TABS
324.0000 mg | ORAL_TABLET | Freq: Two times a day (BID) | ORAL | Status: DC
  Administered 2013-07-18 – 2013-07-19 (×2): 324 mg via ORAL
  Filled 2013-07-18 (×4): qty 1

## 2013-07-18 NOTE — Progress Notes (Signed)
UR COMPLETED  

## 2013-07-18 NOTE — Progress Notes (Signed)
Patient ID: Kristin [name]  female  ZOX:[id number]    DOB: 1971/04/24    DOA: 07/17/2013  PCP: No PCP Per Patient  Assessment/Plan: Principal Problem:   Chest pain with shortness of breath - 2 sets of cardiac markers negative - D-dimer elevated, will check CT angiogram of the chest to rule out any PE - 2-D echo pending  Active Problems:   ANEMIA: - Will check anemia panel, patient also reports menorrhagia, started 2 days ago  DVT Prophylaxis: SCDs  Code Status:  Disposition: Hopefully today if echo normal and a CT angiogram does not show any PE    Subjective: No chest pain or shortness of breath, no nausea or vomiting no fevers  Objective: Weight change:  No intake or output data in the 24 hours ending 07/18/13 1130 Blood pressure 127/92, pulse 18, temperature 98.3 F (36.8 C), temperature source Oral, resp. rate 24, height 5\' 6"  (1.676 m), weight 90.357 kg (199 lb 3.2 oz), last menstrual period 07/17/2013, SpO2 100.00%.  Physical Exam: General: Alert and awake, oriented x3, not in any acute distress. CVS: S1-S2 clear, no murmur rubs or gallops Chest: clear to auscultation bilaterally, no wheezing, rales or rhonchi Abdomen: soft nontender, nondistended, normal bowel sounds  Extremities: no cyanosis, clubbing or edema noted bilaterally Neuro: Cranial nerves II-XII intact, no focal neurological deficits  Lab Results: Basic Metabolic Panel:  Recent Labs Lab 07/17/13 1823 07/18/13 0040  NA 140 137  K 3.7 3.4*  CL 106 105  CO2 22 24  GLUCOSE 95 119*  BUN 13 13  CREATININE 0.69 0.66  CALCIUM 9.1 9.1   CBC:  Recent Labs Lab 07/17/13 1823 07/18/13 0040  WBC 6.8 7.6  HGB 8.5* 8.7*  HCT 28.1* 29.1*  MCV 68.9* 69.5*  PLT 285 281   Cardiac Enzymes:  Recent Labs Lab 07/17/13 2358 07/18/13 0515  TROPONINI <0.30 <0.30      Studies/Results: Dg Chest 2 View  07/17/2013   CLINICAL DATA:  Chest pain and fever  EXAM: CHEST  2 VIEW  COMPARISON:  None.  FINDINGS:  Lungs are clear. Heart size and pulmonary vascularity are normal. No adenopathy. No pneumothorax. 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  IMPRESSION: No edema or consolidation.   Electronically Signed   By: Bretta Bang M.D.   On: 07/17/2013 20:31   Ct Head Wo Contrast  07/17/2013   CLINICAL DATA:  42-year- female with pain and weakness. Initial encounter.  EXAM: CT HEAD WITHOUT CONTRAST  TECHNIQUE: Contiguous axial images were obtained from the base of the skull through the vertex without intravenous contrast.  COMPARISON:  None.  FINDINGS: Minor right sphenoid sinus mucosal thickening. Other Visualized paranasal sinuses and mastoids are clear. No acute osseous abnormality identified. Visualized orbits and scalp soft tissues are within normal limits.  Normal cerebral volume. No midline shift, ventriculomegaly, mass effect, evidence of mass lesion, intracranial hemorrhage or evidence of cortically based acute infarction. Gray-white matter differentiation is within normal limits throughout the brain. No suspicious intracranial vascular hyperdensity.  IMPRESSION: Normal noncontrast CT appearance of the brain.   Electronically Signed   By: Augusto Gamble M.D.   On: 07/17/2013 23:37    Medications: Scheduled Meds: . sodium chloride  3 mL Intravenous Q12H  . sodium chloride  3 mL Intravenous Q12H      LOS: 1 day   Jarely Juncaj M.D. [facility] 07/18/2013, 11:30 AM Pager: [phone]  If 7PM-7AM, please contact night-coverage [email or web address] Password [email or web address]

## 2013-07-19 DIAGNOSIS — I517 Cardiomegaly: Secondary | ICD-10-CM

## 2013-07-19 DIAGNOSIS — K5900 Constipation, unspecified: Secondary | ICD-10-CM

## 2013-07-19 MED ORDER — FERROUS GLUCONATE 324 (38 FE) MG PO TABS: 324.0000 mg | ORAL_TABLET | Freq: Two times a day (BID) | ORAL | Status: DC

## 2013-07-19 NOTE — Progress Notes (Signed)
  Echocardiogram 2D Echocardiogram has been performed.  Cherina Dhillon 07/19/2013, 10:16 AM

## 2013-07-19 NOTE — Care Management (Addendum)
07-19-13 1058 Case Manager did speak to pt in regards to PCP and provided the pt the [facility] Number. Pt is to call and find PCP. No further needs from CM at this time. Gala Lewandowsky, RN,BSN [phone]

## 2013-07-19 NOTE — Discharge Summary (Signed)
Physician Discharge Summary  Patient ID: Kristin [name] MRN: [id number] DOB/AGE: May 04, 1971 42 y.o.  Admit date: 07/17/2013 Discharge date: 07/19/2013  Primary Care Physician:  No PCP Per Patient  Discharge Diagnoses:    . atypical Chest pain . ANEMIA  Consults none   Allergies:  No Known Allergies   Discharge Medications:   Medication List         acetaminophen 500 MG tablet  Commonly known as:  TYLENOL  Take 500 mg by mouth every 6 (six) hours as needed.     ferrous gluconate 324 MG tablet  Commonly known as:  FERGON  Take 1 tablet (324 mg total) by mouth 2 (two) times daily with a meal.         Brief H and P: For complete details please refer to admission H and P, but in brief Kristin [name] is a 42 y.o. female no significant past medical history presented to the ER because of chest pain and shortness of breath. Patient stated that over the last 3-4 days patient had been having increasing exertional chest pain with shortness of breath. Patient stated that with minimal exertion patient was easily getting short of breath. Denied any fever chills productive cough. Patient had been having frontal headache for last few weeks and has been taking Tylenol. Patient has chronic anemia and denies any GI bleed. In the ER cardiac markers and EKG were unremarkable and patient has been limited for further observation  Hospital Course:   Chest pain with shortness of breath: Patient was ruled out for acute disease, cardiac markers were negative. EKG showed no acute ST-T wave changes suggestive of ischemia. D-dimer was elevated at 1.21, patient underwent CT angiogram of the chest which showed minimal linear subsegmental atelectasis of right middle lobe, no evidence of pulmonary embolism. 2-D echocardiogram showed EF of 60-65%, normal wall motion, mild septal hypertrophy  ANEMIA:  -Anemia panel showed iron 14, ferritin 4, B12 and folate were within normal limits, patient was recommended iron  supplementation and follow up with her OB.   Day of Discharge BP 127/87  Pulse 69  Temp(Src) 98.9 F (37.2 C) (Oral)  Resp 18  Ht 5\' 6"  (1.676 m)  Wt 90.357 kg (199 lb 3.2 oz)  BMI 32.17 kg/m2  SpO2 100%  LMP 07/17/2013  Physical Exam: General: Alert and awake oriented x3 not in any acute distress. CVS: S1-S2 clear no murmur rubs or gallops Chest: clear to auscultation bilaterally, no wheezing rales or rhonchi Abdomen: soft nontender, nondistended, normal bowel sounds, no organomegaly Extremities: no cyanosis, clubbing or edema noted bilaterally Neuro: Cranial nerves II-XII intact, no focal neurological deficits   The results of significant diagnostics from this hospitalization (including imaging, microbiology, ancillary and laboratory) are listed below for reference.    LAB RESULTS: Basic Metabolic Panel:  Recent Labs Lab 07/17/13 1823 07/18/13 0040  NA 140 137  K 3.7 3.4*  CL 106 105  CO2 22 24  GLUCOSE 95 119*  BUN 13 13  CREATININE 0.69 0.66  CALCIUM 9.1 9.1   Liver Function Tests: No results found for this basename: AST, ALT, ALKPHOS, BILITOT, PROT, ALBUMIN,  in the last 168 hours No results found for this basename: LIPASE, AMYLASE,  in the last 168 hours No results found for this basename: AMMONIA,  in the last 168 hours CBC:  Recent Labs Lab 07/17/13 1823 07/18/13 0040  WBC 6.8 7.6  HGB 8.5* 8.7*  HCT 28.1* 29.1*  MCV 68.9* 69.5*  PLT 285 281  Cardiac Enzymes:  Recent Labs Lab 07/18/13 0515 07/18/13 1155  TROPONINI <0.30 <0.30   BNP: No components found with this basename: POCBNP,  CBG: No results found for this basename: GLUCAP,  in the last 168 hours  Significant Diagnostic Studies:  Dg Chest 2 View  07/17/2013   CLINICAL DATA:  Chest pain and fever  EXAM: CHEST  2 VIEW  COMPARISON:  None.  FINDINGS: Lungs are clear. Heart size and pulmonary vascularity are normal. No adenopathy. No pneumothorax. 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   IMPRESSION: No edema or consolidation.   Electronically Signed   By: Bretta Bang M.D.   On: 07/17/2013 20:31   Ct Head Wo Contrast  07/17/2013   CLINICAL DATA:  42-year- female with pain and weakness. Initial encounter.  EXAM: CT HEAD WITHOUT CONTRAST  TECHNIQUE: Contiguous axial images were obtained from the base of the skull through the vertex without intravenous contrast.  COMPARISON:  None.  FINDINGS: Minor right sphenoid sinus mucosal thickening. Other Visualized paranasal sinuses and mastoids are clear. No acute osseous abnormality identified. Visualized orbits and scalp soft tissues are within normal limits.  Normal cerebral volume. No midline shift, ventriculomegaly, mass effect, evidence of mass lesion, intracranial hemorrhage or evidence of cortically based acute infarction. Gray-white matter differentiation is within normal limits throughout the brain. No suspicious intracranial vascular hyperdensity.  IMPRESSION: Normal noncontrast CT appearance of the brain.   Electronically Signed   By: Augusto Gamble M.D.   On: 07/17/2013 23:37   Ct Angio Chest Pe W/cm &/or Wo Cm  07/18/2013   CLINICAL DATA:  Chest pain and dizziness for 2-3 weeks, elevated D-dimer, dyspnea, question pulmonary embolism  EXAM: CT ANGIOGRAPHY CHEST WITH CONTRAST  TECHNIQUE: Multidetector CT imaging of the chest was performed using the standard protocol during bolus administration of intravenous contrast. Multiplanar CT image reconstructions including MIPs were obtained to evaluate the vascular anatomy.  CONTRAST:  OMNIPAQUE IOHEXOL 350 MG/ML SOLN  COMPARISON:  None  FINDINGS: Aorta normal caliber without aneurysm or dissection.  Minimal pericardial effusion.  Visualized portion of upper abdomen normal appearance.  Pulmonary arteries patent.  No evidence of pulmonary embolism.  No thoracic adenopathy.  Linear subsegmental atelectasis at base of right middle lobe.  Remaining lungs clear.  No infiltrate, pleural effusion,  pneumothorax, or acute osseous findings.  Review of the MIP images confirms the above findings.  IMPRESSION: Minimal linear subsegmental atelectasis right middle lobe.  Minimal pericardial effusion.  No evidence of pulmonary embolism.   Electronically Signed   By: Ulyses Southward M.D.   On: 07/18/2013 15:39    2D ECHO: Study Conclusions  - Left ventricle: Diastolic dysfunction is noted. Mild septal hypertrophy. The cavity size was normal. Systolic function was normal. The estimated ejection fraction was in the range of 60% to 65%. Wall motion was normal; there were no regional wall motion abnormalities. - Pericardium, extracardiac: A trivial pericardial effusion was identified posterior to the heart.    Disposition and Follow-up:    DISPOSITION: Home  DIET: Heart healthy diet ACTIVITY: As tolerated   DISCHARGE FOLLOW-UP     Follow-up Information   Schedule an appointment as soon as possible for a visit in 2 weeks to follow up. (For hospital followup)    Contact information:   primary care physician      Time spent on Discharge: 35 mins Signed:   Chauntae Hults M.D. [facility] 07/19/2013, 10:35 AM Pager: [phone]

## 2013-07-31 ENCOUNTER — Encounter: Payer: Self-pay | Admitting: Medical

## 2013-07-31 ENCOUNTER — Ambulatory Visit (INDEPENDENT_AMBULATORY_CARE_PROVIDER_SITE_OTHER): Payer: No Typology Code available for payment source | Admitting: Medical

## 2013-07-31 VITALS — BP 132/80 | HR 78 | Temp 98.0°F | Resp 16 | Wt 197.0 lb

## 2013-07-31 DIAGNOSIS — N920 Excessive and frequent menstruation with regular cycle: Secondary | ICD-10-CM

## 2013-07-31 DIAGNOSIS — K5900 Constipation, unspecified: Secondary | ICD-10-CM

## 2013-07-31 DIAGNOSIS — D649 Anemia, unspecified: Secondary | ICD-10-CM

## 2013-07-31 LAB — CBC WITH DIFFERENTIAL/PLATELET
Basophils Absolute: 0 10*3/uL (ref 0.0–0.1)
Basophils Relative: 0 % (ref 0–1)
Eosinophils Absolute: 0 10*3/uL (ref 0.0–0.7)
Eosinophils Relative: 1 % (ref 0–5)
HCT: 33.2 % — ABNORMAL LOW (ref 36.0–46.0)
Hemoglobin: 10.1 g/dL — ABNORMAL LOW (ref 12.0–15.0)
Lymphocytes Relative: 41 % (ref 12–46)
Lymphs Abs: 2.4 10*3/uL (ref 0.7–4.0)
MCH: 22.1 pg — ABNORMAL LOW (ref 26.0–34.0)
MCHC: 30.4 g/dL (ref 30.0–36.0)
MCV: 72.8 fL — ABNORMAL LOW (ref 78.0–100.0)
Monocytes Absolute: 0.3 10*3/uL (ref 0.1–1.0)
Monocytes Relative: 6 % (ref 3–12)
Neutro Abs: 3 10*3/uL (ref 1.7–7.7)
Neutrophils Relative %: 52 % (ref 43–77)
Platelets: 297 10*3/uL (ref 150–400)
RBC: 4.56 MIL/uL (ref 3.87–5.11)
RDW: 26.5 % — ABNORMAL HIGH (ref 11.5–15.5)
WBC: 5.8 10*3/uL (ref 4.0–10.5)

## 2013-07-31 NOTE — Progress Notes (Signed)
Subjective:  Kristin [name] is a 42 y.o. female who presents as a new patient today.   She is accompanied by her friend who helps translate.   She speaks some english.  She is originally from [place], [place].  She hasn't had primary care in 5 years.  Admission 07/17/13 -07/19/2013.   She was recently admitted in the [facility] for chest pain, SOB.  Was found to be anemic.  Started on iron.  Had full workup to rule out cardiac source of chest pain.  She says they think her symptoms were all related to low hemoglobin.  She did not receive a blood transfusion.  She has been taking ferrous gluconate BID the past 3 weeks. She notes long hx/o anemia.  She thinks she may have hx/o fibroids.  She is single.  She has hx/o 5 children, and is considering having one more.  Her children are 4yo, 8yo, 11yo, 16yo, 18yo.  LMP was 07/18/13.  Her periods are regular but heavy usually lasting 5-6 days.   Denies blood in urine or stool . She does have hx/o constipation.  Uses some fiber supplement occasionally.  No prior prescription medication for constipation.  Denies any more SOB or chest pain. No other aggravating or relieving factors.  No other c/o.  The following portions of the patient's history were reviewed and updated as appropriate: allergies, current medications, past family history, past medical history, past social history, past surgical history and problem list.  ROS Otherwise as in subjective above  Objective: Physical Exam BP 132/80  Pulse 78  Temp(Src) 98 F (36.7 C) (Oral)  Resp 16  Wt 197 lb (89.359 kg)  LMP 07/17/2013   General appearance: alert, no distress, WD/WN Oral cavity: MMM, no lesions Neck: supple, no lymphadenopathy, no thyromegaly, no masses Heart: RRR, normal S1, S2, no murmurs Lungs: CTA bilaterally, no wheezes, rhonchi, or rales Abdomen: +bs, soft, non tender, non distended, no masses, no hepatomegaly, no splenomegaly Pulses: 2+ radial pulses, 2+ pedal pulses, normal cap  refill Ext: no edema   Assessment: Encounter Diagnoses  Name Primary?  Marland Kitchen Anemia Yes  . Heavy period   . Unspecified constipation     Plan: Anemia - repeat CBC today.  She has been taking her iron BID.  Stool cards to return.  Reportedly hx/o fibroids and heavy periods.  Will likely refer to gynecology.  Heavy period - consider gyn referral.  Constipation - advised increased water and fiber intake.   Follow up: pending lab

## 2013-07-31 NOTE — Patient Instructions (Signed)
Continue iron twice daily with food or orange juice.  Stop taking iron with milk  Eat plenty of fiber, drink plenty of water daily.  consider adding an OTC fiber supplement such as Fibercon daily.  We will call with lab results  Constipation, Adult Constipation is when a person has fewer than 3 bowel movements a week; has difficulty having a bowel movement; or has stools that are dry, hard, or larger than normal. As people grow older, constipation is more common. If you try to fix constipation with medicines that make you have a bowel movement (laxatives), the problem may get worse. Long-term laxative use may cause the muscles of the colon to become weak. A low-fiber diet, not taking in enough fluids, and taking certain medicines may make constipation worse. CAUSES   Certain medicines, such as antidepressants, pain medicine, iron supplements, antacids, and water pills.   Certain diseases, such as diabetes, irritable bowel syndrome (IBS), thyroid disease, or depression.   Not drinking enough water.   Not eating enough fiber-rich foods.   Stress or travel.  Lack of physical activity or exercise.  Not going to the restroom when there is the urge to have a bowel movement.  Ignoring the urge to have a bowel movement.  Using laxatives too much. SYMPTOMS   Having fewer than 3 bowel movements a week.   Straining to have a bowel movement.   Having hard, dry, or larger than normal stools.   Feeling full or bloated.   Pain in the lower abdomen.  Not feeling relief after having a bowel movement. DIAGNOSIS  Your caregiver will take a medical history and perform a physical exam. Further testing may be done for severe constipation. Some tests may include:   A barium enema X-ray to examine your rectum, colon, and sometimes, your small intestine.  A sigmoidoscopy to examine your lower colon.  A colonoscopy to examine your entire colon. TREATMENT  Treatment will depend on the  severity of your constipation and what is causing it. Some dietary treatments include drinking more fluids and eating more fiber-rich foods. Lifestyle treatments may include regular exercise. If these diet and lifestyle recommendations do not help, your caregiver may recommend taking over-the-counter laxative medicines to help you have bowel movements. Prescription medicines may be prescribed if over-the-counter medicines do not work.  HOME CARE INSTRUCTIONS   Increase dietary fiber in your diet, such as fruits, vegetables, whole grains, and beans. Limit high-fat and processed sugars in your diet, such as [place] fries, hamburgers, cookies, candies, and soda.   A fiber supplement may be added to your diet if you cannot get enough fiber from foods.   Drink enough fluids to keep your urine clear or pale yellow.   Exercise regularly or as directed by your caregiver.   Go to the restroom when you have the urge to go. Do not hold it.  Only take medicines as directed by your caregiver. Do not take other medicines for constipation without talking to your caregiver first. SEEK IMMEDIATE MEDICAL CARE IF:   You have bright red blood in your stool.   Your constipation lasts for more than 4 days or gets worse.   You have abdominal or rectal pain.   You have thin, pencil-like stools.  You have unexplained weight loss. MAKE SURE YOU:   Understand these instructions.  Will watch your condition.  Will get help right away if you are not doing well or get worse. Document Released: 05/28/2004 Document Revised: 11/22/2011 Document  Reviewed: 08/03/2011 ExitCare Patient Information 2014 [place], [place].   Anemia, Nonspecific Anemia is a condition in which the concentration of red blood cells or hemoglobin in the blood is below normal. Hemoglobin is a substance in red blood cells that carries oxygen to the tissues of the body. Anemia results in not enough oxygen reaching these tissues.  CAUSES   Common causes of anemia include:   Excessive bleeding. Bleeding may be internal or external. This includes excessive bleeding from periods (in women) or from the intestine.   Poor nutrition.   Chronic kidney, thyroid, and liver disease.  Bone marrow disorders that decrease red blood cell production.  Cancer and treatments for cancer.  HIV, AIDS, and their treatments.  Spleen problems that increase red blood cell destruction.  Blood disorders.  Excess destruction of red blood cells due to infection, medicines, and autoimmune disorders. SIGNS AND SYMPTOMS   Minor weakness.   Dizziness.   Headache.  Palpitations.   Shortness of breath, especially with exercise.   Paleness.  Cold sensitivity.  Indigestion.  Nausea.  Difficulty sleeping.  Difficulty concentrating. Symptoms may occur suddenly or they may develop slowly.  DIAGNOSIS  Additional blood tests are often needed. These help your health care provider determine the best treatment. Your health care provider will check your stool for blood and look for other causes of blood loss.  TREATMENT  Treatment varies depending on the cause of the anemia. Treatment can include:   Supplements of iron, vitamin B12, or folic acid.   Hormone medicines.   A blood transfusion. This may be needed if blood loss is severe.   Hospitalization. This may be needed if there is significant continual blood loss.   Dietary changes.  Spleen removal. HOME CARE INSTRUCTIONS Keep all follow-up appointments. It often takes many weeks to correct anemia, and having your health care provider check on your condition and your response to treatment is very important. SEEK IMMEDIATE MEDICAL CARE IF:   You develop extreme weakness, shortness of breath, or chest pain.   You become dizzy or have trouble concentrating.  You develop heavy vaginal bleeding.   You develop a rash.   You have bloody or black, tarry stools.    You faint.   You vomit up blood.   You vomit repeatedly.   You have abdominal pain.  You have a fever or persistent symptoms for more than 2 3 days.   You have a fever and your symptoms suddenly get worse.   You are dehydrated.  MAKE SURE YOU:  Understand these instructions.  Will watch your condition.  Will get help right away if you are not doing well or get worse. Document Released: 10/07/2004 Document Revised: 05/02/2013 Document Reviewed: 02/23/2013 [facility] Patient Information 2014 [place], [place].

## 2013-08-03 ENCOUNTER — Other Ambulatory Visit (INDEPENDENT_AMBULATORY_CARE_PROVIDER_SITE_OTHER): Payer: No Typology Code available for payment source

## 2013-08-03 DIAGNOSIS — Z1211 Encounter for screening for malignant neoplasm of colon: Secondary | ICD-10-CM

## 2013-08-03 LAB — HEMOCCULT GUIAC POC 1CARD (OFFICE)
Card #3 Fecal Occult Blood, POC: POSITIVE
Fecal Occult Blood, POC: POSITIVE

## 2013-09-17 ENCOUNTER — Encounter: Payer: Self-pay | Admitting: Medical

## 2013-09-17 ENCOUNTER — Ambulatory Visit (INDEPENDENT_AMBULATORY_CARE_PROVIDER_SITE_OTHER): Payer: Self-pay | Admitting: Medical

## 2013-09-17 VITALS — BP 130/80 | HR 88 | Temp 100.5°F | Resp 18 | Wt 196.0 lb

## 2013-09-17 DIAGNOSIS — R109 Unspecified abdominal pain: Secondary | ICD-10-CM

## 2013-09-17 DIAGNOSIS — R142 Eructation: Secondary | ICD-10-CM

## 2013-09-17 DIAGNOSIS — R140 Abdominal distension (gaseous): Secondary | ICD-10-CM

## 2013-09-17 DIAGNOSIS — R141 Gas pain: Secondary | ICD-10-CM

## 2013-09-17 DIAGNOSIS — R143 Flatulence: Secondary | ICD-10-CM

## 2013-09-17 NOTE — Progress Notes (Signed)
                                                             Subjective:  Kristin [name] is a 43 y.o. female who presents with abdominal pain, bloating, gas.   Started Friday 3.5 days ago.  Been basically unchanged.   She does have a hx/o anemia and constipation.  Is taking iron BID for anemia.  Last BM 2 days ago that seemed normal.  LMP a week ago.   Urine output a little less today than usual.  Has taken nothing for the symptoms.     She denies fever, nausea, vomiting, decreased appetite, back pain, urinary frequency, urgency, blood in urine, no vaginal discharge, no diarrhea, no blood in stool, no vaginal bleeding.  No other aggravating or relieving factors.    No other c/o.  The following portions of the patient's history were reviewed and updated as appropriate: allergies, current medications, past family history, past medical history, past social history, past surgical history and problem list.  ROS Otherwise as in subjective above  Objective: Physical Exam  BP 130/80  Pulse 88  Temp(Src) 100.5 F (38.1 C) (Oral)  Resp 18  Wt 196 lb (88.905 kg)  General appearance: alert, WD/WN, seated on exam table, seems to be in mild discomfort HEENT: normocephalic, sclerae anicteric, conjunctiva pink and moist, TMs pearly, nares patent, no discharge or erythema, pharynx normal Oral cavity: MMM, no lesions Neck: supple, no lymphadenopathy, no thyromegaly, no masses Heart: RRR, normal S1, S2, no murmurs Lungs: CTA bilaterally, no wheezes, rhonchi, or rales Abdomen: +increased bs, distended, mild to moderate generalized tenderness, worse suprapubic, no masses, no hepatomegaly, no splenomegaly Back: nontender Pulses: 2+ radial pulses, 2+ pedal pulses, normal cap refill Ext: no edema   Assessment: Encounter Diagnoses  Name Primary?  . Abdominal pain, unspecified site Yes  . Flatulence symptom   . Bloating      Plan: Discussed possible causes of her pain and symptoms and exam findings.    Advised that I can't rule out worse possibilities without pelvic ultrasound or KUB.   She declines these at this time.  Etiology likely GI related with gas and bloating.  Begin OTC Gas X tablets, stop iron for short term period, avoid solid food intake for the next few hours, and then avoid gas producing foods as discussed.   Advised that if much worse, not improving, unable to urinate, or increasing fever in the next few hours to go to the ED.  If not improving or no worse by tomorrow morning, then we will need to get some imaging, KUB, pelvis US, labs. Call back no later than tomorrow morning.

## 2013-09-17 NOTE — Patient Instructions (Signed)
You exam and symptoms suggest bloating and constipation, but I can't rule out something worse.  Typically with these symptoms and exam findings, we usually get xray and ultrasound of the pelvis.     However, since you have had this 3 days, for the next few hours, begin OTC Gas X or anti gas tablets.    Stop iron/ferrous gluconate for now.  Make sure you are drinking plenty of water.    If no improvement in the next 12 hours, or if worsening abdomen pain, call back or go to the Emergency Department.

## 2013-09-18 ENCOUNTER — Emergency Department (HOSPITAL_COMMUNITY): Payer: No Typology Code available for payment source

## 2013-09-18 ENCOUNTER — Emergency Department (HOSPITAL_COMMUNITY)
Admission: EM | Admit: 2013-09-18 | Discharge: 2013-09-18 | Disposition: A | Discharge status: Home / Self Care | Payer: 59 | Attending: Emergency Medicine | Admitting: Emergency Medicine

## 2013-09-18 ENCOUNTER — Emergency Department (HOSPITAL_COMMUNITY): Payer: 59

## 2013-09-18 ENCOUNTER — Encounter (HOSPITAL_COMMUNITY): Payer: Self-pay | Admitting: Emergency Medicine

## 2013-09-18 DIAGNOSIS — D649 Anemia, unspecified: Secondary | ICD-10-CM | POA: Insufficient documentation

## 2013-09-18 DIAGNOSIS — N7013 Chronic salpingitis and oophoritis: Secondary | ICD-10-CM | POA: Insufficient documentation

## 2013-09-18 DIAGNOSIS — Z3202 Encounter for pregnancy test, result negative: Secondary | ICD-10-CM | POA: Insufficient documentation

## 2013-09-18 DIAGNOSIS — N7011 Chronic salpingitis: Secondary | ICD-10-CM

## 2013-09-18 DIAGNOSIS — Z79899 Other long term (current) drug therapy: Secondary | ICD-10-CM | POA: Insufficient documentation

## 2013-09-18 DIAGNOSIS — R110 Nausea: Secondary | ICD-10-CM | POA: Insufficient documentation

## 2013-09-18 LAB — CBC WITH DIFFERENTIAL/PLATELET
BASOS PCT: 0 % (ref 0–1)
Basophils Absolute: 0 10*3/uL (ref 0.0–0.1)
EOS PCT: 0 % (ref 0–5)
Eosinophils Absolute: 0 10*3/uL (ref 0.0–0.7)
HCT: 34.4 % — ABNORMAL LOW (ref 36.0–46.0)
HEMOGLOBIN: 11.3 g/dL — AB (ref 12.0–15.0)
LYMPHS PCT: 13 % (ref 12–46)
Lymphs Abs: 1.7 10*3/uL (ref 0.7–4.0)
MCH: 26.7 pg (ref 26.0–34.0)
MCHC: 32.8 g/dL (ref 30.0–36.0)
MCV: 81.1 fL (ref 78.0–100.0)
Monocytes Absolute: 1.2 10*3/uL — ABNORMAL HIGH (ref 0.1–1.0)
Monocytes Relative: 9 % (ref 3–12)
NEUTROS PCT: 78 % — AB (ref 43–77)
Neutro Abs: 10 10*3/uL — ABNORMAL HIGH (ref 1.7–7.7)
Platelets: 252 10*3/uL (ref 150–400)
RBC: 4.24 MIL/uL (ref 3.87–5.11)
RDW: 22 % — ABNORMAL HIGH (ref 11.5–15.5)
WBC: 12.9 10*3/uL — AB (ref 4.0–10.5)

## 2013-09-18 LAB — URINE MICROSCOPIC-ADD ON

## 2013-09-18 LAB — POCT URINALYSIS DIPSTICK
Bilirubin, UA: NEGATIVE
Glucose, UA: NEGATIVE
Ketones, UA: NEGATIVE
Nitrite, UA: NEGATIVE
PH UA: 7
Spec Grav, UA: <=1.005
Urobilinogen, UA: 4

## 2013-09-18 LAB — WET PREP, GENITAL
Clue Cells Wet Prep HPF POC: NONE SEEN
TRICH WET PREP: NONE SEEN
WBC, Wet Prep HPF POC: NONE SEEN
Yeast Wet Prep HPF POC: NONE SEEN

## 2013-09-18 LAB — COMPREHENSIVE METABOLIC PANEL
ALBUMIN: 3.3 g/dL — AB (ref 3.5–5.2)
ALT: 15 U/L (ref 0–35)
AST: 18 U/L (ref 0–37)
Alkaline Phosphatase: 85 U/L (ref 39–117)
BILIRUBIN TOTAL: 0.5 mg/dL (ref 0.3–1.2)
BUN: 8 mg/dL (ref 6–23)
CHLORIDE: 98 meq/L (ref 96–112)
CO2: 23 meq/L (ref 19–32)
Calcium: 8.8 mg/dL (ref 8.4–10.5)
Creatinine, Ser: 0.6 mg/dL (ref 0.50–1.10)
GFR calc Af Amer: >90 mL/min (ref >90)
Glucose, Bld: 138 mg/dL — ABNORMAL HIGH (ref 70–99)
Potassium: 3.8 mEq/L (ref 3.7–5.3)
SODIUM: 136 meq/L — AB (ref 137–147)
Total Protein: 7.2 g/dL (ref 6.0–8.3)

## 2013-09-18 LAB — URINALYSIS, ROUTINE W REFLEX MICROSCOPIC
BILIRUBIN URINE: NEGATIVE
Glucose, UA: NEGATIVE mg/dL
Ketones, ur: 15 mg/dL — AB
Leukocytes, UA: NEGATIVE
Nitrite: NEGATIVE
PROTEIN: 30 mg/dL — AB
Specific Gravity, Urine: 1.029 (ref 1.005–1.030)
Urobilinogen, UA: 2 mg/dL — ABNORMAL HIGH (ref 0.0–1.0)
pH: 6.5 (ref 5.0–8.0)

## 2013-09-18 LAB — RPR: RPR: NONREACTIVE

## 2013-09-18 LAB — PREGNANCY, URINE: PREG TEST UR: NEGATIVE

## 2013-09-18 LAB — HIV ANTIBODY (ROUTINE TESTING W REFLEX): HIV: NONREACTIVE

## 2013-09-18 LAB — POCT URINE PREGNANCY: Preg Test, Ur: NEGATIVE

## 2013-09-18 MED ORDER — DOXYCYCLINE HYCLATE 100 MG PO CAPS: 100.0000 mg | ORAL_CAPSULE | Freq: Two times a day (BID) | ORAL | Status: DC

## 2013-09-18 MED ORDER — ONDANSETRON HCL 4 MG/2ML IJ SOLN
4.0000 mg | Freq: Once | INTRAMUSCULAR | Status: AC
  Administered 2013-09-18: 4 mg via INTRAVENOUS
  Filled 2013-09-18: qty 2

## 2013-09-18 MED ORDER — MORPHINE SULFATE 4 MG/ML IJ SOLN
4.0000 mg | Freq: Once | INTRAMUSCULAR | Status: DC
  Filled 2013-09-18: qty 1

## 2013-09-18 MED ORDER — IOHEXOL 300 MG/ML  SOLN
100.0000 mL | Freq: Once | INTRAMUSCULAR | Status: AC | PRN
  Administered 2013-09-18: 100 mL via INTRAVENOUS

## 2013-09-18 MED ORDER — KETOROLAC TROMETHAMINE 30 MG/ML IJ SOLN
30.0000 mg | Freq: Once | INTRAMUSCULAR | Status: AC
  Administered 2013-09-18: 30 mg via INTRAVENOUS
  Filled 2013-09-18: qty 1

## 2013-09-18 MED ORDER — DEXTROSE 5 % IV SOLN
500.0000 mg | Freq: Once | INTRAVENOUS | Status: AC
  Administered 2013-09-18: 500 mg via INTRAVENOUS

## 2013-09-18 MED ORDER — DIPHENHYDRAMINE HCL 50 MG/ML IJ SOLN
INTRAMUSCULAR | Status: AC
  Filled 2013-09-18: qty 1

## 2013-09-18 MED ORDER — SODIUM CHLORIDE 0.9 % IV BOLUS (SEPSIS)
1000.0000 mL | Freq: Once | INTRAVENOUS | Status: AC
  Administered 2013-09-18: 1000 mL via INTRAVENOUS

## 2013-09-18 MED ORDER — DIPHENHYDRAMINE HCL 50 MG/ML IJ SOLN
12.5000 mg | Freq: Once | INTRAMUSCULAR | Status: AC
  Administered 2013-09-18: 12.5 mg via INTRAVENOUS

## 2013-09-18 MED ORDER — MORPHINE SULFATE 4 MG/ML IJ SOLN
4.0000 mg | Freq: Once | INTRAMUSCULAR | Status: AC
  Administered 2013-09-18: 4 mg via INTRAVENOUS
  Filled 2013-09-18: qty 1

## 2013-09-18 MED ORDER — IOHEXOL 300 MG/ML  SOLN: 25.0000 mL | Freq: Once | INTRAMUSCULAR | Status: DC | PRN

## 2013-09-18 MED ORDER — SODIUM CHLORIDE 0.9 % IV SOLN
INTRAVENOUS | Status: DC
  Administered 2013-09-18: 11:00:00 via INTRAVENOUS

## 2013-09-18 MED ORDER — DEXTROSE 5 % IV SOLN
1.0000 g | Freq: Once | INTRAVENOUS | Status: AC
  Administered 2013-09-18: 1 g via INTRAVENOUS
  Filled 2013-09-18: qty 10

## 2013-09-18 MED ORDER — METRONIDAZOLE 500 MG PO TABS: 500.0000 mg | ORAL_TABLET | Freq: Two times a day (BID) | ORAL | Status: DC

## 2013-09-18 NOTE — ED Notes (Signed)
Ambulated patient with assistance to BR

## 2013-09-18 NOTE — ED Notes (Signed)
Pt called in because of uncontrollable shaking and felt like throat was dry.  Bp 160/80, HR 110s and sat 98% RA.  Notified Dr. Wilkie AyeHorton, administered Benadryl 12.5mg  IVP.  Pt talking in full sentences, denies being cold, appears anxious.  Cycling pressures Q395min

## 2013-09-18 NOTE — ED Provider Notes (Signed)
Kristin BillowDjeneba [name] is a 43 y.o. female who presents for evaluation of abdominal pain. I followed up on her CT imaging result for Dr. Wilkie AyeHorton. This showed a possible pelvic abnormality. Therefore, I proceeded with a pelvic examination.  Pelvic exam-normal external female genitalia. Redundant vaginal walls prevented visualization of the cervix. There was a brown vaginal discharge. A wet prep was obtained from the vaginal secretions. A GC and Chlamydia probe were obtained from the posterior vaginal wall/region. On bimanual examination the cervix is anterior, and is nontender. The cervical os is closed. On bimanual examination there is tenderness in the right adnexa, without palpable mass. The ovaries, and the uterus were not palpated, likely secondary to mild obesity. The patient tolerated the exam well.  She was sent for imaging by ultrasound. 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   I discussed the case with the on-call gynecologist, Dr. Ladean Rayaonstance. She advises treating the patient for 2 weeks with Flagyl and doxycycline, then followup in her clinic.  Patient was informed of these findings, advised to undergo pelvic rest, and discharged in stable condition.     Flint MelterElliott L Eletha Culbertson, MD 09/18/13 1409

## 2013-09-18 NOTE — ED Notes (Signed)
Pt states she feels sob, sats wnl, placed patient on 02/2L.  Pt states shaking is better but continues to have the shakes.  Will notify MD

## 2013-09-18 NOTE — Discharge Instructions (Signed)
No sex for 3 weeks See the Gynecologist in 2 weeks. Use Ibuprofen for pain.    Pelvic Inflammatory Disease Pelvic inflammatory disease (PID) refers to an infection in some or all of the female organs. The infection can be in the uterus, ovaries, fallopian tubes, or the surrounding tissues in the pelvis. PID can cause abdominal or pelvic pain that comes on suddenly (acute pelvic pain). PID is a serious infection because it can lead to lasting (chronic) pelvic pain or the inability to have children (infertile).  CAUSES  The infection is often caused by the normal bacteria found in the vaginal tissues. PID may also be caused by an infection that is spread during sexual contact. PID can also occur following:   The birth of a baby.   A miscarriage.   An abortion.   Major pelvic surgery.   The use of an intrauterine device (IUD).   A sexual assault.  RISK FACTORS Certain factors can put a person at higher risk for PID, such as:  Being younger than 25 years.  Being sexually active at [place] age.  Usingnonbarrier contraception.  Havingmultiple sexual partners.  Having sex with someone who has symptoms of a genital infection.  Using oral contraception. Other times, certain behaviors can increase the possibility of getting PID, such as:  Having sex during your period.  Using a vaginal douche.  Having an intrauterine device (IUD) in place. SYMPTOMS   Abdominal or pelvic pain.   Fever.   Chills.   Abnormal vaginal discharge.  Abnormal uterine bleeding.   Unusual pain shortly after finishing your period. DIAGNOSIS  Your caregiver will choose some of the following methods to make a diagnosis, such as:   Performinga physical exam and history. A pelvic exam typically reveals a very tender uterus and surrounding pelvis.   Ordering laboratory tests including a pregnancy test, blood tests, and urine test.  Orderingcultures of the vagina and cervix to  check for a sexually transmitted infection (STI).  Performing an ultrasound.   Performing a laparoscopic procedure to look inside the pelvis.  TREATMENT   Antibiotic medicines may be prescribed and taken by mouth.   Sexual partners may be treated when the infection is caused by a sexually transmitted disease (STD).   Hospitalization may be needed to give antibiotics intravenously.  Surgery may be needed, but this is rare. It may take weeks until you are completely well. If you are diagnosed with PID, you should also be checked for human immunodeficiency virus (HIV). HOME CARE INSTRUCTIONS   If given, take your antibiotics as directed. Finish the medicine even if you start to feel better.   Only take over-the-counter or prescription medicines for pain, discomfort, or fever as directed by your caregiver.   Do not have sexual intercourse until treatment is completed or as directed by your caregiver. If PID is confirmed, your recent sexual partner(s) will need treatment.   Keep your follow-up appointments. SEEK MEDICAL CARE IF:   You have increased or abnormal vaginal discharge.   You need prescription medicine for your pain.   You vomit.   You cannot take your medicines.   Your partner has an STD.  SEEK IMMEDIATE MEDICAL CARE IF:   You have a fever.   You have increased abdominal or pelvic pain.   You have chills.   You have pain when you urinate.   You are not better after 72 hours following treatment.  MAKE SURE YOU:   Understand these instructions.  Will  watch your condition.  Will get help right away if you are not doing well or get worse. Document Released: 08/30/2005 Document Revised: 12/25/2012 Document Reviewed: 08/26/2011 [facility] Patient Information 2014 [place], [place].

## 2013-09-18 NOTE — ED Notes (Signed)
Attempted to draw blood work x2 times, unsuccessful. Phlebotomy called.

## 2013-09-18 NOTE — ED Provider Notes (Signed)
CSN: [id number]     Arrival date & time 09/18/13  0036 History   First MD Initiated Contact with Patient 09/18/13 [phone]     Chief Complaint  Patient presents with  . Abdominal Pain   (Consider location/radiation/quality/duration/timing/severity/associated sxs/prior Treatment) HPI  This a 43 year old female with no significant past medical history who presents with right lower quadrant pain. Patient reports 2 day history of abdominal pain. She reports that it is sharp and nonradiating.  Currently her pain is 10 out of 10. She is taking Tylenol home but that has not helped. She does endorse nausea. She denies any vomiting or diarrhea. Last bowel movement was 2 days ago. She has no history of abdominal surgeries. Last menstrual period was December 24. She is unsure if she could be pregnant.  Past Medical History  Diagnosis Date  . Anemia    History reviewed. No pertinent past surgical history. Family History  Problem Relation Age of Onset  . Other Neg Hx    History  Substance Use Topics  . Smoking status: Never Smoker   . Smokeless tobacco: Not on file  . Alcohol Use: No   OB History   Grav Para Term Preterm Abortions TAB SAB Ect Mult Living                 Review of Systems  Constitutional: Negative for fever.  Respiratory: Negative for cough, chest tightness and shortness of breath.   Cardiovascular: Negative for chest pain.  Gastrointestinal: Positive for nausea and abdominal pain. Negative for vomiting, diarrhea and constipation.  Genitourinary: Negative for dysuria, vaginal bleeding, vaginal discharge and menstrual problem.  Musculoskeletal: Negative for back pain.  Skin: Negative for wound.  Neurological: Negative for headaches.  Psychiatric/Behavioral: Negative for confusion.  All other systems reviewed and are negative.    Allergies  Review of patient's allergies indicates no known allergies.  Home Medications   Current Outpatient Rx  Name  Route  Sig  Dispense   Refill  . acetaminophen (TYLENOL) 500 MG tablet   Oral   Take 500 mg by mouth every 6 (six) hours as needed.         . ferrous gluconate (FERGON) 324 MG tablet   Oral   Take 1 tablet (324 mg total) by mouth 2 (two) times daily with a meal.   60 tablet   3    BP 137/88  Pulse 75  Temp(Src) 98.3 F (36.8 C) (Oral)  Resp 18  SpO2 100%  LMP 09/05/2013 Physical Exam  Nursing note and vitals reviewed. Constitutional: She is oriented to person, place, and time. No distress.  HENT:  Head: Normocephalic and atraumatic.  Eyes: Pupils are equal, round, and reactive to light.  Neck: Neck supple.  Cardiovascular: Normal rate, regular rhythm and normal heart sounds.   No murmur heard. Pulmonary/Chest: Effort normal and breath sounds normal. No respiratory distress. She has no wheezes.  Abdominal: Soft. She exhibits no distension. There is no rebound and no guarding.  Hyperactive bowel sounds, tenderness to palpation over the right lower quadrant without rebound or guarding  Musculoskeletal: She exhibits no edema.  Neurological: She is alert and oriented to person, place, and time.  Skin: Skin is warm and dry.  Psychiatric: She has a normal mood and affect.    ED Course  Procedures (including critical care time) Labs Review Labs Reviewed  URINALYSIS, ROUTINE W REFLEX MICROSCOPIC - Abnormal; Notable for the following:    Color, Urine AMBER (*)  APPearance CLOUDY (*)    Hgb urine dipstick TRACE (*)    Ketones, ur 15 (*)    Protein, ur 30 (*)    Urobilinogen, UA 2.0 (*)    All other components within normal limits  CBC WITH DIFFERENTIAL - Abnormal; Notable for the following:    WBC 12.9 (*)    Hemoglobin 11.3 (*)    HCT 34.4 (*)    RDW 22.0 (*)    All other components within normal limits  COMPREHENSIVE METABOLIC PANEL - Abnormal; Notable for the following:    Sodium 136 (*)    Glucose, Bld 138 (*)    Albumin 3.3 (*)    All other components within normal limits  URINE  MICROSCOPIC-ADD ON - Abnormal; Notable for the following:    Squamous Epithelial / LPF FEW (*)    All other components within normal limits  PREGNANCY, URINE   Imaging Review No results found.  EKG Interpretation   None       MDM  No diagnosis found.   The patient presents with right lower quadrant abdominal pain. Onset of symptoms 2 days ago. She has tenderness to palpation of the right lower corporate without rebound or guarding. Patient was given pain medication. Lab work is notable for mild leukocytosis at 12.9. Patient will be sent for CT scan to rule out appendicitis. Ovarian pathology is also a possibility; however, given duration of symptoms have low suspicion for torsion and will rule out appendicitis first.  Patient signed out to Dr. Effie Shy.    Shon Baton, MD 09/19/13 [phone]

## 2013-09-18 NOTE — ED Notes (Signed)
Pt in c/o abd pain and distention, also nausea, denies vomiting, last BM was two days ago

## 2013-09-18 NOTE — ED Notes (Signed)
Reassessed patient and resting, shaking almost resolved.  VSS and remains on 02/2L.  Pt has started drinking first cup of contrast

## 2013-09-19 ENCOUNTER — Emergency Department (HOSPITAL_COMMUNITY)
Admission: EM | Admit: 2013-09-19 | Discharge: 2013-09-19 | Disposition: A | Discharge status: Home / Self Care | Payer: No Typology Code available for payment source | Attending: Emergency Medicine | Admitting: Emergency Medicine

## 2013-09-19 ENCOUNTER — Encounter (HOSPITAL_COMMUNITY): Payer: Self-pay | Admitting: Emergency Medicine

## 2013-09-19 DIAGNOSIS — Z792 Long term (current) use of antibiotics: Secondary | ICD-10-CM | POA: Insufficient documentation

## 2013-09-19 DIAGNOSIS — R5381 Other malaise: Secondary | ICD-10-CM | POA: Insufficient documentation

## 2013-09-19 DIAGNOSIS — R112 Nausea with vomiting, unspecified: Secondary | ICD-10-CM | POA: Insufficient documentation

## 2013-09-19 DIAGNOSIS — R51 Headache: Secondary | ICD-10-CM | POA: Insufficient documentation

## 2013-09-19 DIAGNOSIS — R5383 Other fatigue: Secondary | ICD-10-CM

## 2013-09-19 DIAGNOSIS — M542 Cervicalgia: Secondary | ICD-10-CM | POA: Insufficient documentation

## 2013-09-19 DIAGNOSIS — R519 Headache, unspecified: Secondary | ICD-10-CM

## 2013-09-19 DIAGNOSIS — Z862 Personal history of diseases of the blood and blood-forming organs and certain disorders involving the immune mechanism: Secondary | ICD-10-CM | POA: Insufficient documentation

## 2013-09-19 DIAGNOSIS — Z79899 Other long term (current) drug therapy: Secondary | ICD-10-CM | POA: Insufficient documentation

## 2013-09-19 LAB — GC/CHLAMYDIA PROBE AMP
CT Probe RNA: NEGATIVE
GC Probe RNA: NEGATIVE

## 2013-09-19 MED ORDER — METOCLOPRAMIDE HCL 10 MG PO TABS: 10.0000 mg | ORAL_TABLET | Freq: Four times a day (QID) | ORAL | Status: DC | PRN

## 2013-09-19 MED ORDER — ONDANSETRON 8 MG PO TBDP: ORAL_TABLET | ORAL | Status: DC

## 2013-09-19 MED ORDER — METOCLOPRAMIDE HCL 5 MG/ML IJ SOLN
10.0000 mg | Freq: Once | INTRAMUSCULAR | Status: AC
  Administered 2013-09-19: 10 mg via INTRAVENOUS
  Filled 2013-09-19: qty 2

## 2013-09-19 MED ORDER — MORPHINE SULFATE 4 MG/ML IJ SOLN
8.0000 mg | Freq: Once | INTRAMUSCULAR | Status: AC
  Administered 2013-09-19: 8 mg via INTRAVENOUS
  Filled 2013-09-19: qty 2

## 2013-09-19 MED ORDER — DEXAMETHASONE SODIUM PHOSPHATE 10 MG/ML IJ SOLN
10.0000 mg | Freq: Once | INTRAMUSCULAR | Status: AC
  Administered 2013-09-19: 10 mg via INTRAVENOUS
  Filled 2013-09-19: qty 1

## 2013-09-19 MED ORDER — OXYCODONE-ACETAMINOPHEN 5-325 MG PO TABS: 2.0000 | ORAL_TABLET | ORAL | Status: DC | PRN

## 2013-09-19 MED ORDER — SODIUM CHLORIDE 0.9 % IV BOLUS (SEPSIS)
1000.0000 mL | Freq: Once | INTRAVENOUS | Status: AC
  Administered 2013-09-19: 1000 mL via INTRAVENOUS

## 2013-09-19 MED ORDER — DIPHENHYDRAMINE HCL 50 MG/ML IJ SOLN
50.0000 mg | Freq: Once | INTRAMUSCULAR | Status: AC
  Administered 2013-09-19: 50 mg via INTRAVENOUS
  Filled 2013-09-19: qty 1

## 2013-09-19 NOTE — Discharge Instructions (Signed)

## 2013-09-19 NOTE — ED Provider Notes (Signed)
CSN: [id number]     Arrival date & time 09/19/13  1226 History   First MD Initiated Contact with Patient 09/19/13 1612     Chief Complaint  Patient presents with  . Headache  . Neck Pain   (Consider location/radiation/quality/duration/timing/severity/associated sxs/prior Treatment) HPI Gradual onset 3-4 days right lower quadrant pain evaluated yesterday in the emergency department including CT scan showing normal appendix, since discharge has developed gradual onset overnight of global diffuse throbbing headache like she gets once or twice a year with nausea and 3 episodes of vomiting with pain radiating to her neck as well with no sudden onset headache no fever no chills no body aches no confusion no change in speech vision swallowing or understanding no focal or lateralizing weakness or numbness just feels generally weak but still is able to walk unassisted today normally prior to arrival according to patient and family.   Past Medical History  Diagnosis Date  . Anemia    History reviewed. No pertinent past surgical history. Family History  Problem Relation Age of Onset  . Other Neg Hx    History  Substance Use Topics  . Smoking status: Never Smoker   . Smokeless tobacco: Not on file  . Alcohol Use: No   OB History   Grav Para Term Preterm Abortions TAB SAB Ect Mult Living                 Review of Systems 10 Systems reviewed and are negative for acute change except as noted in the HPI. Allergies  Review of patient's allergies indicates no known allergies.  Home Medications   Current Outpatient Rx  Name  Route  Sig  Dispense  Refill  . doxycycline (VIBRAMYCIN) 100 MG capsule   Oral   Take 100 mg by mouth 2 (two) times daily. One po bid x 7 days. Started medication on 09-18-12         . metroNIDAZOLE (FLAGYL) 500 MG tablet   Oral   Take 1 tablet (500 mg total) by mouth 2 (two) times daily. One po bid x 7 days   28 tablet   0   . metoCLOPramide (REGLAN) 10 MG  tablet   Oral   Take 1 tablet (10 mg total) by mouth every 6 (six) hours as needed for nausea (nausea/headache).   6 tablet   0   . ondansetron (ZOFRAN ODT) 8 MG disintegrating tablet      8mg  ODT q4 hours prn nausea   4 tablet   0   . oxyCODONE-acetaminophen (PERCOCET) 5-325 MG per tablet   Oral   Take 2 tablets by mouth every 4 (four) hours as needed.   10 tablet   0    BP 113/74  Pulse 90  Temp(Src) 100.2 F (37.9 C) (Oral)  Resp 18  SpO2 96%  LMP 09/05/2013 Physical Exam  Nursing note and vitals reviewed. Constitutional:  Awake, alert, nontoxic appearance with baseline speech for patient.  HENT:  Head: Atraumatic.  Mouth/Throat: No oropharyngeal exudate.  Eyes: EOM are normal. Pupils are equal, round, and reactive to light. Right eye exhibits no discharge. Left eye exhibits no discharge.  Neck: Neck supple.  Bilateral posterior neck and trapezius area tender   Cardiovascular: Normal rate and regular rhythm.   No murmur heard. Pulmonary/Chest: Effort normal and breath sounds normal. No stridor. No respiratory distress. She has no wheezes. She has no rales. She exhibits no tenderness.  Abdominal: Soft. Bowel sounds are normal. She exhibits no  distension and no mass. There is tenderness. There is no rebound and no guarding.  abdomen is mild right lower quadrant tenderness without rebound  Musculoskeletal: She exhibits no tenderness.  Baseline ROM, moves extremities with no obvious new focal weakness.  Lymphadenopathy:    She has no cervical adenopathy.  Neurological: She is alert.  Awake, alert, cooperative and aware of situation; motor strength 5/5 bilaterally; sensation normal to light touch bilaterally; peripheral visual fields full to confrontation; no facial asymmetry; tongue midline; major cranial nerves appear intact; no pronator drift, normal finger to nose bilaterally  Skin: No rash noted.  Psychiatric: She has a normal mood and affect.    ED Course   Procedures (including critical care time) Pt feels improved after observation and/or treatment in ED.Patient / Family / Caregiver informed of clinical course, understand medical decision-making process, and agree with plan. Much better. Labs Review Labs Reviewed - No data to display Imaging Review US Transvaginal Non-ob  09/18/2013   CLINICAL DATA:  43 year old female with pelvic pain. Abnormal tubular lesion in the right hemipelvis on CT. Initial encounter.  EXAM: TRANSABDOMINAL AND TRANSVAGINAL ULTRASOUND OF PELVIS  TECHNIQUE: Both transabdominal and transvaginal ultrasound examinations of the pelvis were performed. Transabdominal technique was performed for global imaging of the pelvis including uterus, ovaries, adnexal regions, and pelvic cul-de-sac. It was necessary to proceed with endovaginal exam following the transabdominal exam to visualize the ovaries.  COMPARISON:  CT Abdomen and Pelvis from the same day reported separately. Non OB pelvis ultrasound 03/26/2008.  FINDINGS: Uterus  Measurements: 10.6 x 5.3 x 7.7 cm. No fibroids or other mass visualized.  Endometrium  Thickness: 6 mm.  No focal abnormality visualized.  Right ovary  Measurements: 3.6 x 2.6 x 3.0 cm. Dilated serpiginous tubular structure containing echogenic debris (image 51), measuring up to 36 mm diameter. This corresponds to the CT abnormality. No similar finding in 2009. Otherwise the right ovary appears within normal limits, containing a small simple cyst (image 46).  Left ovary  Measurements: 3.5 x 3.0 x 4.1 cm. Normal appearance/no adnexal mass.  Other findings  No free fluid.  IMPRESSION: 1. 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 This corresponds to the CT abnormality and is a new finding since 2009 pelvis ultrasound. 2. No pelvic free fluid or other acute finding.   Electronically Signed   By: Augusto Gamble M.D.   On: 09/18/2013 12:47   US Pelvis Complete  09/18/2013   CLINICAL DATA:  43 year old female  with pelvic pain. Abnormal tubular lesion in the right hemipelvis on CT. Initial encounter.  EXAM: TRANSABDOMINAL AND TRANSVAGINAL ULTRASOUND OF PELVIS  TECHNIQUE: Both transabdominal and transvaginal ultrasound examinations of the pelvis were performed. Transabdominal technique was performed for global imaging of the pelvis including uterus, ovaries, adnexal regions, and pelvic cul-de-sac. It was necessary to proceed with endovaginal exam following the transabdominal exam to visualize the ovaries.  COMPARISON:  CT Abdomen and Pelvis from the same day reported separately. Non OB pelvis ultrasound 03/26/2008.  FINDINGS: Uterus  Measurements: 10.6 x 5.3 x 7.7 cm. No fibroids or other mass visualized.  Endometrium  Thickness: 6 mm.  No focal abnormality visualized.  Right ovary  Measurements: 3.6 x 2.6 x 3.0 cm. Dilated serpiginous tubular structure containing echogenic debris (image 51), measuring up to 36 mm diameter. This corresponds to the CT abnormality. No similar finding in 2009. Otherwise the right ovary appears within normal limits, containing a small simple cyst (image 46).  Left  ovary  Measurements: 3.5 x 3.0 x 4.1 cm. Normal appearance/no adnexal mass.  Other findings  No free fluid.  IMPRESSION: 1. 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 This corresponds to the CT abnormality and is a new finding since 2009 pelvis ultrasound. 2. No pelvic free fluid or other acute finding.   Electronically Signed   By: Augusto Gamble M.D.   On: 09/18/2013 12:47   Ct Abdomen Pelvis W Contrast  09/18/2013   CLINICAL DATA:  Abdominal distention.  Nausea, vomiting and chills.  EXAM: CT ABDOMEN AND PELVIS WITH CONTRAST  TECHNIQUE: Multidetector CT imaging of the abdomen and pelvis was performed using the standard protocol following bolus administration of intravenous contrast.  CONTRAST:  100 mL OMNIPAQUE IOHEXOL 300 MG/ML  SOLN  COMPARISON:  CT abdomen and pelvis 11/17/2007.  FINDINGS: The lung bases are  clear. There is mild cardiomegaly. There is a trace amount of pericardial fluid. No pleural effusion is identified.  The gallbladder, liver, spleen, adrenal glands, left kidney and pancreas are unremarkable. A 1.1 cm cyst in the lower pole of the right kidney is unchanged. There is a cystic lesion in the right adnexa measuring 8.1 cm transverse by 4.5 cm AP x 5.1 cm craniocaudal. A portion of this may represent the fallopian tube. The lesion displaces the uterus to the left. It was present on the prior study but has increased in size. Small left ovarian cyst is identified. Calcification in the urinary bladder wall eccentric anteriorly and on the right appears unchanged. The stomach, small and large bowel and appendix appear normal. No focal bony abnormality is identified.  IMPRESSION: Cystic lesion in the right adnexa may represent a combination ovarian cyst and hydro or pyosalpinx. Correlation with pelvic examination is recommended. Pelvic ultrasound would also be useful for further evaluation.  Unchanged calcification ni the urinary bladder wall most consistent with prior infectious or inflammatory process.   Electronically Signed   By: Drusilla Kanner M.D.   On: 09/18/2013 08:51    EKG Interpretation   None       MDM   1. Headache    I doubt any other EMC precluding discharge at this time including, but not necessarily limited to the following:SAH, CVA, SBI.    Hurman Horn, MD 09/20/13 [phone]

## 2013-09-19 NOTE — ED Notes (Signed)
Pt was seen here yesterday for abdominal pain and discharged.  Went home and developed headache, neck pain, and has low grade temp.

## 2013-09-24 ENCOUNTER — Inpatient Hospital Stay (HOSPITAL_COMMUNITY)
Admission: EM | Admit: 2013-09-24 | Discharge: 2013-09-26 | DRG: 759 | Disposition: A | Discharge status: Home / Self Care | Payer: Medicaid Other | Attending: Family Medicine | Admitting: Family Medicine

## 2013-09-24 ENCOUNTER — Emergency Department (HOSPITAL_COMMUNITY): Payer: Medicaid Other

## 2013-09-24 ENCOUNTER — Encounter (HOSPITAL_COMMUNITY): Payer: Self-pay | Admitting: Emergency Medicine

## 2013-09-24 DIAGNOSIS — R109 Unspecified abdominal pain: Secondary | ICD-10-CM | POA: Diagnosis present

## 2013-09-24 DIAGNOSIS — N730 Acute parametritis and pelvic cellulitis: Secondary | ICD-10-CM | POA: Diagnosis not present

## 2013-09-24 DIAGNOSIS — R21 Rash and other nonspecific skin eruption: Secondary | ICD-10-CM | POA: Diagnosis present

## 2013-09-24 DIAGNOSIS — N7013 Chronic salpingitis and oophoritis: Secondary | ICD-10-CM | POA: Diagnosis present

## 2013-09-24 DIAGNOSIS — N7011 Chronic salpingitis: Secondary | ICD-10-CM

## 2013-09-24 LAB — CBC WITH DIFFERENTIAL/PLATELET
Basophils Absolute: 0 10*3/uL (ref 0.0–0.1)
Basophils Relative: 0 % (ref 0–1)
EOS PCT: 1 % (ref 0–5)
Eosinophils Absolute: 0.1 10*3/uL (ref 0.0–0.7)
HCT: 32.9 % — ABNORMAL LOW (ref 36.0–46.0)
Hemoglobin: 11 g/dL — ABNORMAL LOW (ref 12.0–15.0)
LYMPHS ABS: 2 10*3/uL (ref 0.7–4.0)
Lymphocytes Relative: 18 % (ref 12–46)
MCH: 26 pg (ref 26.0–34.0)
MCHC: 33.4 g/dL (ref 30.0–36.0)
MCV: 77.8 fL — AB (ref 78.0–100.0)
MONO ABS: 1 10*3/uL (ref 0.1–1.0)
Monocytes Relative: 9 % (ref 3–12)
NEUTROS ABS: 7.8 10*3/uL — AB (ref 1.7–7.7)
Neutrophils Relative %: 72 % (ref 43–77)
Platelets: 305 10*3/uL (ref 150–400)
RBC: 4.23 MIL/uL (ref 3.87–5.11)
RDW: 21.7 % — AB (ref 11.5–15.5)
WBC: 10.9 10*3/uL — ABNORMAL HIGH (ref 4.0–10.5)

## 2013-09-24 LAB — URINALYSIS W MICROSCOPIC + REFLEX CULTURE
Bilirubin Urine: NEGATIVE
Glucose, UA: NEGATIVE mg/dL
Ketones, ur: NEGATIVE mg/dL
NITRITE: NEGATIVE
Protein, ur: NEGATIVE mg/dL
Specific Gravity, Urine: 1.019 (ref 1.005–1.030)
UROBILINOGEN UA: 1 mg/dL (ref 0.0–1.0)
pH: 7 (ref 5.0–8.0)

## 2013-09-24 LAB — POCT I-STAT, CHEM 8
BUN: 9 mg/dL (ref 6–23)
Calcium, Ion: 1.12 mmol/L (ref 1.12–1.23)
Chloride: 99 mEq/L (ref 96–112)
Creatinine, Ser: 0.7 mg/dL (ref 0.50–1.10)
Glucose, Bld: 117 mg/dL — ABNORMAL HIGH (ref 70–99)
HCT: 36 % (ref 36.0–46.0)
Hemoglobin: 12.2 g/dL (ref 12.0–15.0)
Potassium: 3.8 mEq/L (ref 3.7–5.3)
Sodium: 137 mEq/L (ref 137–147)
TCO2: 27 mmol/L (ref 0–100)

## 2013-09-24 MED ORDER — HYDROMORPHONE HCL PF 1 MG/ML IJ SOLN: 0.5000 mg | INTRAMUSCULAR | Status: DC | PRN

## 2013-09-24 MED ORDER — DOXYCYCLINE HYCLATE 100 MG IV SOLR
100.0000 mg | INTRAVENOUS | Status: AC
  Administered 2013-09-24: 100 mg via INTRAVENOUS
  Filled 2013-09-24: qty 100

## 2013-09-24 MED ORDER — HYDROMORPHONE HCL PF 1 MG/ML IJ SOLN
1.0000 mg | Freq: Once | INTRAMUSCULAR | Status: AC
  Administered 2013-09-24: 1 mg via INTRAMUSCULAR
  Filled 2013-09-24: qty 1

## 2013-09-24 MED ORDER — PIPERACILLIN-TAZOBACTAM 3.375 G IVPB 30 MIN
3.3750 g | Freq: Once | INTRAVENOUS | Status: AC
  Administered 2013-09-24: 3.375 g via INTRAVENOUS
  Filled 2013-09-24: qty 50

## 2013-09-24 MED ORDER — SODIUM CHLORIDE 0.9 % IV SOLN
INTRAVENOUS | Status: AC
  Administered 2013-09-25: 01:00:00 via INTRAVENOUS

## 2013-09-24 NOTE — ED Notes (Signed)
Pt is here with right lower abdominal pain, no nvd.  Pt has painful red, pustule rash around mouth-?shingles

## 2013-09-24 NOTE — ED Provider Notes (Signed)
CSN: [id number]     Arrival date & time 09/24/13  1327 History   First MD Initiated Contact with Patient 09/24/13 1719     Chief Complaint  Patient presents with  . Abdominal Pain  . Rash   (Consider location/radiation/quality/duration/timing/severity/associated sxs/prior Treatment) HPI Kristin [name] is a 43 y.o. female who presents to emergency department complaining of right lower quadrant abdominal pain and rash around her lips. Patient states that her lower abdominal pain has been there for over a week. This is patient's third emergency department visit for the same. PT was diagnosed with hydrosalpinx. [facility] however she apparently did not understand instructions and did not follow up. Pt was prescribed doxycycline and flagyl which she is still taking. She did run out of percocet. She states she feels like pain is worsening. She states pain is worsened with movement, and palpation of the abdomen. She denies fever, chills, urinary symptoms, vaginal complaints. Currently taking tylenol with no relief. PT also complaining about rash round her lips. Started 4 days ago. It is painful. Applying abreva cream topically.   Past Medical History  Diagnosis Date  . Anemia    No past surgical history on file. Family History  Problem Relation Age of Onset  . Other Neg Hx    History  Substance Use Topics  . Smoking status: Never Smoker   . Smokeless tobacco: Not on file  . Alcohol Use: No   OB History   Grav Para Term Preterm Abortions TAB SAB Ect Mult Living                 Review of Systems  Constitutional: Negative for fever and chills.  Respiratory: Negative for cough, chest tightness and shortness of breath.   Cardiovascular: Negative for chest pain, palpitations and leg swelling.  Gastrointestinal: Positive for abdominal pain. Negative for nausea, vomiting and diarrhea.  Genitourinary: Negative for dysuria, flank pain, vaginal bleeding,  vaginal discharge, vaginal pain and pelvic pain.  Musculoskeletal: Negative for arthralgias, myalgias, neck pain and neck stiffness.  Skin: Negative for rash.  Neurological: Negative for dizziness, weakness and headaches.  All other systems reviewed and are negative.    Allergies  Review of patient's allergies indicates no known allergies.  Home Medications   Current Outpatient Rx  Name  Route  Sig  Dispense  Refill  . acetaminophen (TYLENOL) 500 MG tablet   Oral   Take 500 mg by mouth 2 (two) times daily as needed (pain).         . Docosanol (ABREVA) 10 % CREA   Apply externally   Apply 1 application topically as needed (topically to mouth sores.).         Marland Kitchen. doxycycline (VIBRAMYCIN) 100 MG capsule   Oral   Take 100 mg by mouth 2 (two) times daily. One po bid x 7 days. Started medication on 09-20-12         . metroNIDAZOLE (FLAGYL) 500 MG tablet   Oral   Take 500 mg by mouth 2 (two) times daily. 7 days. Started on 09/18/2013          BP 106/89  Pulse 86  Temp(Src) 98.5 F (36.9 C) (Oral)  Resp 24  Wt 193 lb 5 oz (87.686 kg)  SpO2 100%  LMP 09/05/2013 Physical Exam  Nursing note and vitals reviewed. Constitutional: She appears well-developed and well-nourished. No distress.  HENT:  Head: Normocephalic.  Eyes: Conjunctivae are normal.  Neck:  Neck supple.  Cardiovascular: Normal rate, regular rhythm and normal heart sounds.   Pulmonary/Chest: Effort normal and breath sounds normal. No respiratory distress. She has no wheezes. She has no rales.  Abdominal: Soft. Bowel sounds are normal. She exhibits no distension. There is tenderness. There is no rebound.  RLQ tenderness  Musculoskeletal: She exhibits no edema.  Neurological: She is alert.  Skin: Skin is warm and dry.  Psychiatric: She has a normal mood and affect. Her behavior is normal.    ED Course  Procedures (including critical care time) Labs Review Labs Reviewed  URINALYSIS W MICROSCOPIC + REFLEX  CULTURE - Abnormal; Notable for the following:    Color, Urine AMBER (*)    Hgb urine dipstick LARGE (*)    Leukocytes, UA SMALL (*)    Bacteria, UA FEW (*)    All other components within normal limits  CBC WITH DIFFERENTIAL - Abnormal; Notable for the following:    WBC 10.9 (*)    Hemoglobin 11.0 (*)    HCT 32.9 (*)    MCV 77.8 (*)    RDW 21.7 (*)    Neutro Abs 7.8 (*)    All other components within normal limits  POCT I-STAT, CHEM 8 - Abnormal; Notable for the following:    Glucose, Bld 117 (*)    All other components within normal limits   Imaging Review US Transvaginal Non-ob  09/24/2013   CLINICAL DATA:  Right adnexal pain  EXAM: TRANSABDOMINAL AND TRANSVAGINAL ULTRASOUND OF PELVIS  TECHNIQUE: Both transabdominal and transvaginal ultrasound examinations of the pelvis were performed. Transabdominal technique was performed for global imaging of the pelvis including uterus, ovaries, adnexal regions, and pelvic cul-de-sac. It was necessary to proceed with endovaginal exam following the transabdominal exam to visualize the adnexa.  COMPARISON:  09/18/2013  FINDINGS: Uterus  Measurements: 14.0 x 5.3 x 6.5 cm. No fibroids or other mass visualized.  Endometrium  Thickness: 15 mm in thickness.  The endometrium is heterogeneous.  Right ovary  Measurements: 4.1 x 2.7 x 3.9 cm. 2.3 x 1.7 x 3.5 cm simple cysts within the right ovary. Serpiginous echogenic fluid-filled serpiginous structure in the right adnexa has subjectively enlarged.  Left ovary  Measurements: 4.5 x 4.0 x 5.1 cm. Left adnexal cystic lesion has increased in size from 2.7 cm to 4.3 cm in maximal diameter.  Other findings  No free fluid.  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 Echogenic fluid suggests debris and possibly pyosalpinx. Correlate clinically.  Cystic lesion in the left ovary is larger with a maximal diameter of 4.3 cm. Continued short-term follow-up ultrasound in 6-12 weeks is recommended to  ensure resolution.  3.5 cm cystic lesion in the right ovary is also noted. Followup is reiterated.   Electronically Signed   By: Maryclare Bean M.D.   On: 09/24/2013 21:13   US Pelvis Complete  09/24/2013   CLINICAL DATA:  Right adnexal pain  EXAM: TRANSABDOMINAL AND TRANSVAGINAL ULTRASOUND OF PELVIS  TECHNIQUE: Both transabdominal and transvaginal ultrasound examinations of the pelvis were performed. Transabdominal technique was performed for global imaging of the pelvis including uterus, ovaries, adnexal regions, and pelvic cul-de-sac. It was necessary to proceed with endovaginal exam following the transabdominal exam to visualize the adnexa.  COMPARISON:  09/18/2013  FINDINGS: Uterus  Measurements: 14.0 x 5.3 x 6.5 cm. No fibroids or other mass visualized.  Endometrium  Thickness: 15 mm in thickness.  The endometrium is heterogeneous.  Right ovary  Measurements: 4.1 x  2.7 x 3.9 cm. 2.3 x 1.7 x 3.5 cm simple cysts within the right ovary. Serpiginous echogenic fluid-filled serpiginous structure in the right adnexa has subjectively enlarged.  Left ovary  Measurements: 4.5 x 4.0 x 5.1 cm. Left adnexal cystic lesion has increased in size from 2.7 cm to 4.3 cm in maximal diameter.  Other findings  No free fluid.  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 Echogenic fluid suggests debris and possibly pyosalpinx. Correlate clinically.  Cystic lesion in the left ovary is larger with a maximal diameter of 4.3 cm. Continued short-term follow-up ultrasound in 6-12 weeks is recommended to ensure resolution.  3.5 cm cystic lesion in the right ovary is also noted. Followup is reiterated.   Electronically Signed   By: Maryclare Bean M.D.   On: 09/24/2013 21:13    EKG Interpretation   None       MDM   1. Hydrosalpinx   2. Abdominal pain     Patient here with a right adnexal pain that's been there for a week and a half. She has been here twice for the same pain a week ago. CT showed a right  adnexal structure, and was followed for ultrasound which showed hydrosalpinx with debris. At that time OB/GYN was consulted and she was started on antibiotic.   patient's pain is worsening. She does have all elevated white count. She denies fevers however. Ultrasound was repeated and it showed enlarging tubal cyst. They did not measure this time. I discussed this case with Dr. Shawnie Pons at [facility]. She offered option of admitting patient for IV antibiotics is due to failed outpatient treatment. Another option would be discharging patient home, continue antibiotics, and followup with OB/GYN outpatient. I discussed this with patient. She is not comfortable going home due to the worsening pain. [facility]  Filed Vitals:   09/24/13 1930 09/24/13 1945 09/24/13 2000 09/24/13 2206  BP: 135/85 133/71 151/96 87/68  Pulse: 71 81 78 77  Temp:      TempSrc:      Resp:    20  Weight:      SpO2: 100% 97% 100% 99%       Lottie Mussel, PA-C 09/24/13 2207

## 2013-09-25 DIAGNOSIS — N730 Acute parametritis and pelvic cellulitis: Secondary | ICD-10-CM | POA: Diagnosis present

## 2013-09-25 DIAGNOSIS — N7013 Chronic salpingitis and oophoritis: Secondary | ICD-10-CM

## 2013-09-25 LAB — SEDIMENTATION RATE: Sed Rate: 66 mm/hr — ABNORMAL HIGH (ref 0–22)

## 2013-09-25 MED ORDER — VALACYCLOVIR HCL 500 MG PO TABS
500.0000 mg | ORAL_TABLET | Freq: Two times a day (BID) | ORAL | Status: DC
  Administered 2013-09-25 – 2013-09-26 (×4): 500 mg via ORAL
  Filled 2013-09-25 (×4): qty 1

## 2013-09-25 MED ORDER — DEXTROSE IN LACTATED RINGERS 5 % IV SOLN
INTRAVENOUS | Status: DC
  Administered 2013-09-25 (×2): via INTRAVENOUS

## 2013-09-25 MED ORDER — ALUM & MAG HYDROXIDE-SIMETH 200-200-20 MG/5ML PO SUSP: 30.0000 mL | ORAL | Status: DC | PRN

## 2013-09-25 MED ORDER — DEXTROSE 5 % IV SOLN
1.0000 g | Freq: Four times a day (QID) | INTRAVENOUS | Status: DC
  Administered 2013-09-25 – 2013-09-26 (×6): 1 g via INTRAVENOUS
  Filled 2013-09-25 (×7): qty 1

## 2013-09-25 MED ORDER — HYDROMORPHONE HCL PF 1 MG/ML IJ SOLN: 1.0000 mg | Freq: Once | INTRAMUSCULAR | Status: DC

## 2013-09-25 MED ORDER — IBUPROFEN 600 MG PO TABS
600.0000 mg | ORAL_TABLET | Freq: Four times a day (QID) | ORAL | Status: DC | PRN
  Administered 2013-09-25 (×2): 600 mg via ORAL
  Filled 2013-09-25 (×2): qty 1

## 2013-09-25 MED ORDER — TEMAZEPAM 15 MG PO CAPS: 15.0000 mg | ORAL_CAPSULE | Freq: Every evening | ORAL | Status: DC | PRN

## 2013-09-25 MED ORDER — GUAIFENESIN 100 MG/5ML PO SOLN: 15.0000 mL | ORAL | Status: DC | PRN

## 2013-09-25 MED ORDER — MENTHOL 3 MG MT LOZG: 1.0000 | LOZENGE | OROMUCOSAL | Status: DC | PRN

## 2013-09-25 NOTE — Progress Notes (Signed)
Subjective:  Patient reports no problems voiding.  Pain is somewhat improved.  Objective: I have reviewed patient's vital signs, intake and output, medications, labs and radiology results.  General: alert, cooperative and appears stated age GI: normal findings: no masses palpable and no rebound and abnormal findings:  moderate tenderness in the RLQ Extremities: extremities normal, atraumatic, no cyanosis or edema   Assessment/Plan: PID slow improvement.  Afebrile with pain improving.  Typical course discussed.   LOS: 1 day    Kristin [name] S 09/25/2013, 7:58 AM

## 2013-09-25 NOTE — Progress Notes (Signed)
UR completed 

## 2013-09-25 NOTE — H&P (Signed)
Chief Complaint   Patient presents with   .  Abdominal Pain   .  Rash    HPI  Kristin [name] is a 43 y.o. female who presents to emergency department complaining of right lower quadrant abdominal pain and rash around her lips. Patient states that her lower abdominal pain has been there for over a week. This is patient's third emergency department visit for the same. PT was diagnosed with hydrosalpinx. [facility], however she apparently did not understand instructions and did not follow up. Pt was prescribed doxycycline and flagyl which she is still taking. She did run out of percocet. She states she feels like pain is worsening. She states pain is worsened with movement, and palpation of the abdomen. She denies fever, chills, urinary symptoms, vaginal complaints. Currently taking tylenol with no relief. PT also complaining about rash round her lips. Started 4 days ago. It is painful. Applying abreva cream topically.   Past Medical History   Diagnosis  Date   .  Anemia     No past surgical history on file.   Family History   Problem  Relation  Age of Onset   .  Other  Neg Hx     History   Substance Use Topics   .  Smoking status:  Never Smoker   .  Smokeless tobacco:  Not on file   .  Alcohol Use:  No    OB History    Grav  Para  Term  Preterm  Abortions  TAB  SAB  Ect  Mult  Living                 Review of Systems  Constitutional: Negative for fever and chills.  Respiratory: Negative for cough, chest tightness and shortness of breath.  Cardiovascular: Negative for chest pain, palpitations and leg swelling.  Gastrointestinal: Positive for abdominal pain. Negative for nausea, vomiting and diarrhea.  Genitourinary: Negative for dysuria, flank pain, vaginal bleeding, vaginal discharge, vaginal pain and pelvic pain.  Musculoskeletal: Negative for arthralgias, myalgias, neck pain and neck stiffness.  Skin: Negative for rash.  Neurological:  Negative for dizziness, weakness and headaches.  All other systems reviewed and are negative.   Allergies   Review of patient's allergies indicates no known allergies.   Home Medications    Current Outpatient Rx   Name   Route   Sig   Dispense   Refill   .  acetaminophen (TYLENOL) 500 MG tablet   Oral   Take 500 mg by mouth 2 (two) times daily as needed (pain).       .  Docosanol (ABREVA) 10 % CREA   Apply externally   Apply 1 application topically as needed (topically to mouth sores.).       Marland Kitchen  doxycycline (VIBRAMYCIN) 100 MG capsule   Oral   Take 100 mg by mouth 2 (two) times daily. One po bid x 7 days. Started medication on 09-20-12       .  metroNIDAZOLE (FLAGYL) 500 MG tablet   Oral   Take 500 mg by mouth 2 (two) times daily. 7 days. Started on 09/18/2013        Vitals: BP 106/89  Pulse 86  Temp(Src) 98.5 F (36.9 C) (Oral)  Resp 24  Wt 193 lb 5 oz (87.686 kg)  SpO2 100%  LMP 09/05/2013  Physical Exam  Nursing note and vitals reviewed.  Constitutional: She appears well-developed and  well-nourished. No distress.  HENT:  Head: Normocephalic.  Eyes: Conjunctivae are normal.  Neck: Neck supple.  Cardiovascular: Normal rate, regular rhythm and normal heart sounds.  Pulmonary/Chest: Effort normal and breath sounds normal. No respiratory distress. She has no wheezes. She has no rales.  Abdominal: Soft. Bowel sounds are normal. She exhibits no distension. There is tenderness. There is no rebound.  RLQ tenderness  Musculoskeletal: She exhibits no edema.  Neurological: She is alert.  Skin: Skin is warm and dry.  Psychiatric: She has a normal mood and affect. Her behavior is normal.    Labs Reviewed   URINALYSIS W MICROSCOPIC + REFLEX CULTURE - Abnormal; Notable for the following:    Color, Urine  AMBER (*)     Hgb urine dipstick  LARGE (*)     Leukocytes, UA  SMALL (*)     Bacteria, UA  FEW (*)     All other components within normal limits   CBC WITH DIFFERENTIAL - Abnormal;  Notable for the following:    WBC  10.9 (*)     Hemoglobin  11.0 (*)     HCT  32.9 (*)     MCV  77.8 (*)     RDW  21.7 (*)     Neutro Abs  7.8 (*)     All other components within normal limits   POCT I-STAT, CHEM 8 - Abnormal; Notable for the following:    Glucose, Bld  117 (*)     All other components within normal limits    Imaging Review  [place] Pelvis Complete  09/24/2013 CLINICAL DATA: Right adnexal pain EXAM: TRANSABDOMINAL AND TRANSVAGINAL ULTRASOUND OF PELVIS TECHNIQUE: Both transabdominal and transvaginal ultrasound examinations of the pelvis were performed. Transabdominal technique was performed for global imaging of the pelvis including uterus, ovaries, adnexal regions, and pelvic cul-de-sac. It was necessary to proceed with endovaginal exam following the transabdominal exam to visualize the adnexa. COMPARISON: 09/18/2013 FINDINGS: Uterus Measurements: 14.0 x 5.3 x 6.5 cm. No fibroids or other mass visualized. Endometrium Thickness: 15 mm in thickness. The endometrium is heterogeneous. Right ovary Measurements: 4.1 x 2.7 x 3.9 cm. 2.3 x 1.7 x 3.5 cm simple cysts within the right ovary. Serpiginous echogenic fluid-filled serpiginous structure in the right adnexa has subjectively enlarged. Left ovary Measurements: 4.5 x 4.0 x 5.1 cm. Left adnexal cystic lesion has increased in size from 2.7 cm to 4.3 cm in maximal diameter. Other findings No free fluid. 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 Echogenic fluid suggests debris and possibly pyosalpinx. Correlate clinically. Cystic lesion in the left ovary is larger with a maximal diameter of 4.3 cm. Continued short-term follow-up ultrasound in 6-12 weeks is recommended to ensure resolution. 3.5 cm cystic lesion in the right ovary is also noted. Followup is reiterated. Electronically Signed By: Maryclare BeanArt Hoss M.D. On: 09/24/2013 21:13   Assessment  1.  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 Treatment.  2.   Abdominal pain    Plan: Admit to 3rd floor IV antibiotics

## 2013-09-26 MED ORDER — CIPROFLOXACIN HCL 500 MG PO TABS: 500.0000 mg | ORAL_TABLET | Freq: Two times a day (BID) | ORAL | Status: DC

## 2013-09-26 MED ORDER — METRONIDAZOLE 500 MG PO TABS: 500.0000 mg | ORAL_TABLET | Freq: Three times a day (TID) | ORAL | Status: DC

## 2013-09-26 MED ORDER — OXYCODONE-ACETAMINOPHEN 5-325 MG PO TABS: 1.0000 | ORAL_TABLET | ORAL | Status: DC | PRN

## 2013-09-26 MED ORDER — VALACYCLOVIR HCL 500 MG PO TABS: 500.0000 mg | ORAL_TABLET | Freq: Two times a day (BID) | ORAL | Status: DC

## 2013-09-26 MED ORDER — IBUPROFEN 600 MG PO TABS: 600.0000 mg | ORAL_TABLET | Freq: Four times a day (QID) | ORAL | Status: DC | PRN

## 2013-09-26 NOTE — Discharge Instructions (Signed)
Pelvic Inflammatory Disease  Pelvic inflammatory disease (PID) refers to an infection in some or all of the female organs. The infection can be in the uterus, ovaries, fallopian tubes, or the surrounding tissues in the pelvis. PID can cause abdominal or pelvic pain that comes on suddenly (acute pelvic pain). PID is a serious infection because it can lead to lasting (chronic) pelvic pain or the inability to have children (infertile).   CAUSES   The infection is often caused by the normal bacteria found in the vaginal tissues. PID may also be caused by an infection that is spread during sexual contact. PID can also occur following:   · The birth of a baby.    · A miscarriage.    · An abortion.    · Major pelvic surgery.    · The use of an intrauterine device (IUD).    · A sexual assault.    RISK FACTORS  Certain factors can put a person at higher risk for PID, such as:  · Being younger than 25 years.  · Being sexually active at a young age.  · Using nonbarrier contraception.  · Having multiple sexual partners.  · Having sex with someone who has symptoms of a genital infection.  · Using oral contraception.  Other times, certain behaviors can increase the possibility of getting PID, such as:  · Having sex during your period.  · Using a vaginal douche.  · Having an intrauterine device (IUD) in place.  SYMPTOMS   · Abdominal or pelvic pain.    · Fever.    · Chills.    · Abnormal vaginal discharge.  · Abnormal uterine bleeding.    · Unusual pain shortly after finishing your period.  DIAGNOSIS   Your caregiver will choose some of the following methods to make a diagnosis, such as:   · Performing a physical exam and history. A pelvic exam typically reveals a very tender uterus and surrounding pelvis.    · Ordering laboratory tests including a pregnancy test, blood tests, and urine test.   · Ordering cultures of the vagina and cervix to check for a sexually transmitted infection (STI).  · Performing an ultrasound.     · Performing a laparoscopic procedure to look inside the pelvis.    TREATMENT   · Antibiotic medicines may be prescribed and taken by mouth.    · Sexual partners may be treated when the infection is caused by a sexually transmitted disease (STD).    · Hospitalization may be needed to give antibiotics intravenously.  · Surgery may be needed, but this is rare.  It may take weeks until you are completely well. If you are diagnosed with PID, you should also be checked for human immunodeficiency virus (HIV).    HOME CARE INSTRUCTIONS   · If given, take your antibiotics as directed. Finish the medicine even if you start to feel better.    · Only take over-the-counter or prescription medicines for pain, discomfort, or fever as directed by your caregiver.    · Do not have sexual intercourse until treatment is completed or as directed by your caregiver. If PID is confirmed, your recent sexual partner(s) will need treatment.    · Keep your follow-up appointments.  SEEK MEDICAL CARE IF:   · You have increased or abnormal vaginal discharge.    · You need prescription medicine for your pain.    · You vomit.    · You cannot take your medicines.    · Your partner has an STD.    SEEK IMMEDIATE MEDICAL CARE IF:   · You have a fever.    · You have increased abdominal or   pelvic pain.    · You have chills.    · You have pain when you urinate.    · You are not better after 72 hours following treatment.    MAKE SURE YOU:   · Understand these instructions.  · Will watch your condition.  · Will get help right away if you are not doing well or get worse.  Document Released: 08/30/2005 Document Revised: 12/25/2012 Document Reviewed: 08/26/2011  [employer]® Patient Information ©2014 [employer].

## 2013-09-26 NOTE — Progress Notes (Signed)
Pt s  Teaching complete   Appointment made  For followup   prescripitions    Sent to  [facility]

## 2013-09-26 NOTE — Discharge Summary (Signed)
Physician Discharge Summary  Patient ID: Kristin BillowDjeneba Bushard MRN: [id number] DOB/AGE: 45/09/1970 45 y.o.  Admit date: 09/24/2013 Discharge date: 09/26/2013  Admission Diagnoses:  Pelvic inflammatory disease, failed outpatient managment  Discharge Diagnoses: same Active Problems:   Hydrosalpinx   PID (acute pelvic inflammatory disease)   Discharged Condition: good  Hospital Course: Pt is a 43 yo female who [facility] for PID that did not respond to outpatient management.  Pt had been taking doxycline and flagyl bid.  Pt was given IV antibiotics for 2 days and did well.  No fevers and abdominal exam markedly improved.  Pt is tolerating food, ambulating, and passing gas.  Pt is ready for discharge.  Pt to complete 10 more days of outpatient antibiotic treatment.  [facility].  Consults: None  Significant Diagnostic Studies: radiology: Ultrasound: transvaginal--hydrosalpinx  Treatments: antibiotics: cefoxitin  Discharge Exam: Blood pressure 110/67, pulse 76, temperature 97.9 F (36.6 C), temperature source Oral, resp. rate 18, height 5\' 5"  (1.651 m), weight 190 lb (86.183 kg), last menstrual period 09/05/2013, SpO2 98.00%. General appearance: alert, cooperative and no distress GI: soft, miild tenderness in lower mid pelvis, no rebound, no guarding. Extremities: extremities normal, atraumatic, no cyanosis or edema and Homans sign is negative, no sign of DVT  Disposition: 01-Home or Self Care  Discharge Orders   Future Orders Complete By Expires   Discharge patient  As directed        Medication List    STOP taking these medications       ABREVA 10 % Crea  Generic drug:  Docosanol     acetaminophen 500 MG tablet  Commonly known as:  TYLENOL     doxycycline 100 MG capsule  Commonly known as:  VIBRAMYCIN      TAKE these medications       ciprofloxacin 500 MG tablet  Commonly known as:  CIPRO  Take 1 tablet (500 mg  total) by mouth 2 (two) times daily.     ibuprofen 600 MG tablet  Commonly known as:  ADVIL,MOTRIN  Take 1 tablet (600 mg total) by mouth every 6 (six) hours as needed (mild pain).     metroNIDAZOLE 500 MG tablet  Commonly known as:  FLAGYL  Take 1 tablet (500 mg total) by mouth 3 (three) times daily. 7 days. Started on 09/18/2013     oxyCODONE-acetaminophen 5-325 MG per tablet  Commonly known as:  ROXICET  Take 1 tablet by mouth every 4 (four) hours as needed for severe pain.     valACYclovir 500 MG tablet  Commonly known as:  VALTREX  Take 1 tablet (500 mg total) by mouth 2 (two) times daily.           Follow-up Information   [facility] weeks.   Contact information:   [address] [place] [phone]       Signed: Lesly DukesLEGGETT,Skyelar Swigart H. 09/26/2013, 7:55 AM

## 2013-09-28 NOTE — ED Provider Notes (Signed)
Medical screening examination/treatment/procedure(s) were performed by non-physician practitioner and as supervising physician I was immediately available for consultation/collaboration.  EKG Interpretation    Date/Time:    Ventricular Rate:    PR Interval:    QRS Duration:   QT Interval:    QTC Calculation:   R Axis:     Text Interpretation:               Raeford RazorStephen Kimyata Milich, MD 09/28/13 1441

## 2013-10-04 ENCOUNTER — Encounter (HOSPITAL_COMMUNITY): Payer: Self-pay | Admitting: *Deleted

## 2013-10-04 ENCOUNTER — Inpatient Hospital Stay (HOSPITAL_COMMUNITY): Payer: Medicaid Other

## 2013-10-04 ENCOUNTER — Inpatient Hospital Stay (HOSPITAL_COMMUNITY)
Admission: AD | Admit: 2013-10-04 | Discharge: 2013-10-08 | DRG: 759 | Disposition: A | Discharge status: Home / Self Care | Payer: Medicaid Other | Source: Ambulatory Visit | Attending: Obstetrics & Gynecology | Admitting: Obstetrics & Gynecology

## 2013-10-04 DIAGNOSIS — N7013 Chronic salpingitis and oophoritis: Secondary | ICD-10-CM | POA: Diagnosis present

## 2013-10-04 DIAGNOSIS — N739 Female pelvic inflammatory disease, unspecified: Secondary | ICD-10-CM

## 2013-10-04 DIAGNOSIS — N7093 Salpingitis and oophoritis, unspecified: Principal | ICD-10-CM | POA: Diagnosis present

## 2013-10-04 HISTORY — DX: Female pelvic inflammatory disease, unspecified: N73.9

## 2013-10-04 LAB — WET PREP, GENITAL
Clue Cells Wet Prep HPF POC: NONE SEEN
Trich, Wet Prep: NONE SEEN
WBC, Wet Prep HPF POC: NONE SEEN
YEAST WET PREP: NONE SEEN

## 2013-10-04 LAB — CBC
HCT: 33.3 % — ABNORMAL LOW (ref 36.0–46.0)
Hemoglobin: 10.9 g/dL — ABNORMAL LOW (ref 12.0–15.0)
MCH: 26.1 pg (ref 26.0–34.0)
MCHC: 32.7 g/dL (ref 30.0–36.0)
MCV: 79.9 fL (ref 78.0–100.0)
Platelets: 461 10*3/uL — ABNORMAL HIGH (ref 150–400)
RBC: 4.17 MIL/uL (ref 3.87–5.11)
RDW: 21.6 % — AB (ref 11.5–15.5)
WBC: 7 10*3/uL (ref 4.0–10.5)

## 2013-10-04 LAB — URINE MICROSCOPIC-ADD ON

## 2013-10-04 LAB — URINALYSIS, ROUTINE W REFLEX MICROSCOPIC
Bilirubin Urine: NEGATIVE
Glucose, UA: NEGATIVE mg/dL
Hgb urine dipstick: NEGATIVE
Ketones, ur: NEGATIVE mg/dL
Nitrite: NEGATIVE
PH: 6.5 (ref 5.0–8.0)
Protein, ur: NEGATIVE mg/dL
SPECIFIC GRAVITY, URINE: 1.025 (ref 1.005–1.030)
Urobilinogen, UA: 0.2 mg/dL (ref 0.0–1.0)

## 2013-10-04 MED ORDER — ZOLPIDEM TARTRATE 5 MG PO TABS: 5.0000 mg | ORAL_TABLET | Freq: Every evening | ORAL | Status: DC | PRN

## 2013-10-04 MED ORDER — ONDANSETRON HCL 4 MG PO TABS: 4.0000 mg | ORAL_TABLET | Freq: Four times a day (QID) | ORAL | Status: DC | PRN

## 2013-10-04 MED ORDER — IBUPROFEN 600 MG PO TABS
600.0000 mg | ORAL_TABLET | Freq: Four times a day (QID) | ORAL | Status: DC | PRN
  Administered 2013-10-04 – 2013-10-07 (×4): 600 mg via ORAL
  Filled 2013-10-04 (×4): qty 1

## 2013-10-04 MED ORDER — HYDROMORPHONE HCL 2 MG PO TABS: 2.0000 mg | ORAL_TABLET | ORAL | Status: DC | PRN

## 2013-10-04 MED ORDER — DEXTROSE 5 % IV SOLN
2.0000 g | Freq: Four times a day (QID) | INTRAVENOUS | Status: DC
  Administered 2013-10-04 – 2013-10-06 (×6): 2 g via INTRAVENOUS
  Filled 2013-10-04 (×7): qty 2

## 2013-10-04 MED ORDER — HYDROMORPHONE HCL PF 1 MG/ML IJ SOLN
1.0000 mg | INTRAMUSCULAR | Status: AC
  Administered 2013-10-04: 1 mg via INTRAMUSCULAR
  Filled 2013-10-04: qty 1

## 2013-10-04 MED ORDER — HYDROMORPHONE HCL PF 1 MG/ML IJ SOLN
1.0000 mg | INTRAMUSCULAR | Status: DC | PRN
  Administered 2013-10-05: 1 mg via INTRAVENOUS
  Filled 2013-10-04: qty 1

## 2013-10-04 MED ORDER — SODIUM CHLORIDE 0.9 % IV SOLN
INTRAVENOUS | Status: DC
  Administered 2013-10-05 – 2013-10-06 (×2): via INTRAVENOUS

## 2013-10-04 MED ORDER — ONDANSETRON 8 MG PO TBDP
8.0000 mg | ORAL_TABLET | ORAL | Status: AC
  Administered 2013-10-04: 8 mg via ORAL
  Filled 2013-10-04: qty 1

## 2013-10-04 MED ORDER — ONDANSETRON HCL 4 MG/2ML IJ SOLN: 4.0000 mg | Freq: Four times a day (QID) | INTRAMUSCULAR | Status: DC | PRN

## 2013-10-04 NOTE — MAU Provider Note (Signed)
History     CSN: [id number]  Arrival date and time: 10/04/13 1725   None     Chief Complaint  Patient presents with  . Abdominal Pain  . Headache  . Emesis   HPI  Kristin [name] is a 43 yo G5P5 who presents with abdominal pain since 0100 hrs yesterday morning.  States the pain is 7/10, constant, sharp, and has no alleviating factors.  Pt reports associated vomiting (x3) immediately after trying to eat.  She also endorses a HA, 10/10, all around her head with pressure felt behind her eyes. She denies fever, sore throat, cough, diarrhea and vaginal discharge and/or bleeding.  She was admitted on 1/12 at [facility] for PID that was not responding to outpatient treatment.  She showed improvement with two days of Cefoxitin and discharged with a prescription for Cipro and Flagyl on 1/14.  A transvaginal US showed hydrosalpinx.     OB History   Grav Para Term Preterm Abortions TAB SAB Ect Mult Living   5 5        5       Past Medical History  Diagnosis Date  . Anemia   . PID (pelvic inflammatory disease)     Past Surgical History  Procedure Laterality Date  . No past surgeries      Family History  Problem Relation Age of Onset  . Other Neg Hx     History  Substance Use Topics  . Smoking status: Never Smoker   . Smokeless tobacco: Not on file  . Alcohol Use: No    Allergies:  Allergies  Allergen Reactions  . Zosyn [Piperacillin Sod-Tazobactam So] Itching    Pt reports itching when receiving zosyn    Prescriptions prior to admission  Medication Sig Dispense Refill  . ciprofloxacin (CIPRO) 500 MG tablet Take 500 mg by mouth 2 (two) times daily. X 14 days. Started on 09/26/2013.      . ibuprofen (ADVIL,MOTRIN) 600 MG tablet Take 1 tablet (600 mg total) by mouth every 6 (six) hours as needed (mild pain).  30 tablet  0  . metroNIDAZOLE (FLAGYL) 500 MG tablet Take 500 mg by mouth 3 (three) times daily. X 17 days. Started on 09/18/2013      . [DISCONTINUED] ciprofloxacin (CIPRO)  500 MG tablet Take 1 tablet (500 mg total) by mouth 2 (two) times daily.  28 tablet  0  . [DISCONTINUED] metroNIDAZOLE (FLAGYL) 500 MG tablet Take 1 tablet (500 mg total) by mouth 3 (three) times daily. 7 days. Started on 09/18/2013  30 tablet  0    Review of Systems  Constitutional: Negative for fever and chills.  HENT: Negative for sore throat.   Eyes: Negative for blurred vision.  Respiratory: Negative for cough.   Cardiovascular: Negative for chest pain.  Gastrointestinal: Positive for nausea, vomiting and abdominal pain. Negative for diarrhea.  Genitourinary: Negative for dysuria, urgency and frequency.       No vaginal bleeding or discharge  Neurological: Positive for headaches. Negative for dizziness and weakness.   Physical Exam   Blood pressure 133/87, pulse 73, temperature 98.5 F (36.9 C), temperature source Oral, resp. rate 16, last menstrual period 09/24/2013, SpO2 100.00%.  Physical Exam  Constitutional: She is oriented to person, place, and time. She appears well-developed and well-nourished. No distress.  HENT:  Head: Normocephalic.  Cardiovascular: Normal rate and normal heart sounds.   Respiratory: Breath sounds normal.  GI: Soft. There is tenderness.  Genitourinary:  Speculum exam:  Vagina: Scant  creamy, white discharge noted.  No blood noted.  Cervix: No erythema, no lesions noted.  Bimanual exam:  Uterus: No tenderness. Cervix: No CMT Adnexa: R sided tenderness and  mass noted.  L side non tender.  Wet prep obtained. Chaperone present for exam.  Neurological: She is alert and oriented to person, place, and time.  Psychiatric: She has a normal mood and affect. Her behavior is normal.    Complete Pelvic US (1/12)  CLINICAL DATA: Right adnexal pain   EXAM:  TRANSABDOMINAL AND TRANSVAGINAL ULTRASOUND OF PELVIS   TECHNIQUE:  Both transabdominal and transvaginal ultrasound examinations of the  pelvis were performed. Transabdominal technique was  performed for  global imaging of the pelvis including uterus, ovaries, adnexal  regions, and pelvic cul-de-sac. It was necessary to proceed with  endovaginal exam following the transabdominal exam to visualize the  adnexa.   COMPARISON: 09/18/2013  FINDINGS: Uterus  Measurements: 14.0 x 5.3 x 6.5 cm. No fibroids or other mass visualized.  Endometrium Thickness: 15 mm in thickness. The endometrium is heterogeneous.  Right ovary Measurements: 4.1 x 2.7 x 3.9 cm. 2.3 x 1.7 x 3.5 cm simple cysts within the right ovary. Serpiginous echogenic fluid-filled serpiginous structure in the right adnexa has subjectively enlarged.  Left ovary Measurements: 4.5 x 4.0 x 5.1 cm. Left adnexal cystic lesion has  increased in size from 2.7 cm to 4.3 cm in maximal diameter.  Other findings  No free fluid.   IMPRESSION:  Right adnexal hydrosalpinx is subjectively worse and larger.  Echogenic fluid suggests debris and possibly pyosalpinx. Correlate  clinically. Cystic lesion in the left ovary is larger with a maximal diameter of  4.3 cm. Continued short-term follow-up ultrasound in 6-12 weeks is  recommended to ensure resolution. 3.5 cm cystic lesion in the right ovary is also noted.  Followup is reiterated.   Electronically Signed  By: Maryclare BeanArt Hoss M.D.  On: 09/24/2013 21:13   MAU Course  Procedures  MDM  Transvaginal US Dilaudid Zofran Urinalysis CBC    Assessment and Plan  Abdominal pain  S/P inpatient Tx for PID on 09/24/13  U/S pending Dilaudid 1 mg IM and Zofran 8 mg ODT given in MAU  Toilolo, Tifi 10/04/2013, 6:29 PM   I have seen this patient and agree with the above PA student's note.  LEFTWICH-KIRBY, Rowe Warman Certified Nurse-Midwife  Report to Dorathy KinsmanVirginia Smith, CNM with ultrasound results pending

## 2013-10-04 NOTE — MAU Note (Signed)
Patient states she was in the hospital for abdominal pain. Started having right lower abdominal pain again yesterday with a bad headache with nausea and vomiting, no diarrhea. States it is the same pain that she was admitted for.

## 2013-10-05 ENCOUNTER — Ambulatory Visit (HOSPITAL_COMMUNITY)
Admit: 2013-10-05 | Discharge: 2013-10-05 | Disposition: A | Discharge status: Home / Self Care | Payer: 59 | Attending: Internal Medicine | Admitting: Internal Medicine

## 2013-10-05 ENCOUNTER — Encounter (HOSPITAL_COMMUNITY): Payer: Self-pay | Admitting: Radiology

## 2013-10-05 ENCOUNTER — Observation Stay (HOSPITAL_COMMUNITY): Payer: Medicaid Other

## 2013-10-05 ENCOUNTER — Ambulatory Visit (HOSPITAL_COMMUNITY)
Admit: 2013-10-05 | Discharge: 2013-10-05 | Disposition: A | Discharge status: Home / Self Care | Payer: Medicaid Other | Attending: Obstetrics & Gynecology | Admitting: Obstetrics & Gynecology

## 2013-10-05 DIAGNOSIS — N7093 Salpingitis and oophoritis, unspecified: Secondary | ICD-10-CM | POA: Diagnosis present

## 2013-10-05 DIAGNOSIS — N731 Chronic parametritis and pelvic cellulitis: Secondary | ICD-10-CM | POA: Insufficient documentation

## 2013-10-05 DIAGNOSIS — R109 Unspecified abdominal pain: Secondary | ICD-10-CM | POA: Diagnosis present

## 2013-10-05 DIAGNOSIS — N7013 Chronic salpingitis and oophoritis: Secondary | ICD-10-CM | POA: Diagnosis present

## 2013-10-05 LAB — COMPREHENSIVE METABOLIC PANEL
ALK PHOS: 60 U/L (ref 39–117)
ALT: 12 U/L (ref 0–35)
AST: 13 U/L (ref 0–37)
Albumin: 2.9 g/dL — ABNORMAL LOW (ref 3.5–5.2)
BILIRUBIN TOTAL: 0.3 mg/dL (ref 0.3–1.2)
BUN: 9 mg/dL (ref 6–23)
CHLORIDE: 101 meq/L (ref 96–112)
CO2: 25 mEq/L (ref 19–32)
Calcium: 8.6 mg/dL (ref 8.4–10.5)
Creatinine, Ser: 0.59 mg/dL (ref 0.50–1.10)
GLUCOSE: 129 mg/dL — AB (ref 70–99)
Potassium: 3.5 mEq/L — ABNORMAL LOW (ref 3.7–5.3)
Sodium: 137 mEq/L (ref 137–147)
Total Protein: 6 g/dL (ref 6.0–8.3)

## 2013-10-05 LAB — CBC
HCT: 31.3 % — ABNORMAL LOW (ref 36.0–46.0)
Hemoglobin: 10.2 g/dL — ABNORMAL LOW (ref 12.0–15.0)
MCH: 26 pg (ref 26.0–34.0)
MCHC: 32.6 g/dL (ref 30.0–36.0)
MCV: 79.8 fL (ref 78.0–100.0)
PLATELETS: 409 10*3/uL — AB (ref 150–400)
RBC: 3.92 MIL/uL (ref 3.87–5.11)
RDW: 21.4 % — AB (ref 11.5–15.5)
WBC: 6 10*3/uL (ref 4.0–10.5)

## 2013-10-05 LAB — PREGNANCY, URINE: Preg Test, Ur: NEGATIVE

## 2013-10-05 LAB — POCT PREGNANCY, URINE: PREG TEST UR: NEGATIVE

## 2013-10-05 MED ORDER — IOHEXOL 300 MG/ML  SOLN
50.0000 mL | INTRAMUSCULAR | Status: AC
  Administered 2013-10-05: 50 mL via ORAL

## 2013-10-05 MED ORDER — IOHEXOL 300 MG/ML  SOLN
100.0000 mL | Freq: Once | INTRAMUSCULAR | Status: AC | PRN
  Administered 2013-10-05: 100 mL via INTRAVENOUS

## 2013-10-05 MED ORDER — MIDAZOLAM HCL 2 MG/2ML IJ SOLN
INTRAMUSCULAR | Status: AC | PRN
  Administered 2013-10-05 (×2): 0.5 mg via INTRAVENOUS
  Administered 2013-10-05: 1 mg via INTRAVENOUS
  Administered 2013-10-05 (×4): 0.5 mg via INTRAVENOUS

## 2013-10-05 MED ORDER — FENTANYL CITRATE 0.05 MG/ML IJ SOLN
INTRAMUSCULAR | Status: AC | PRN
  Administered 2013-10-05 (×2): 25 ug via INTRAVENOUS
  Administered 2013-10-05: 50 ug via INTRAVENOUS
  Administered 2013-10-05 (×2): 25 ug via INTRAVENOUS

## 2013-10-05 MED ORDER — DOXYCYCLINE HYCLATE 100 MG IV SOLR
100.0000 mg | Freq: Two times a day (BID) | INTRAVENOUS | Status: DC
  Administered 2013-10-05 – 2013-10-06 (×2): 100 mg via INTRAVENOUS
  Filled 2013-10-05 (×3): qty 100

## 2013-10-05 MED ORDER — MIDAZOLAM HCL 2 MG/2ML IJ SOLN
INTRAMUSCULAR | Status: AC
  Filled 2013-10-05: qty 6

## 2013-10-05 MED ORDER — FENTANYL CITRATE 0.05 MG/ML IJ SOLN
INTRAMUSCULAR | Status: AC
  Filled 2013-10-05: qty 6

## 2013-10-05 NOTE — Consult Note (Signed)
HPI: Kristin [name] is an 43 y.o. female with PID and evidence of right adnexal mass/abscess. IR requested to perc drain. Dr. Kathlene Cote has reviewed images. PMHx and meds reviewed.  Past Medical History:  Past Medical History  Diagnosis Date  . Anemia   . PID (pelvic inflammatory disease)     Past Surgical History:  Past Surgical History  Procedure Laterality Date  . No past surgeries      Family History:  Family History  Problem Relation Age of Onset  . Other Neg Hx     Social History:  reports that she has never smoked. She does not have any smokeless tobacco history on file. She reports that she does not drink alcohol or use illicit drugs.  Allergies:  Allergies  Allergen Reactions  . Zosyn [Piperacillin Sod-Tazobactam So] Itching    Pt reports itching when receiving zosyn    Medications:   Medication List    ASK your doctor about these medications       ciprofloxacin 500 MG tablet  Commonly known as:  CIPRO  Take 500 mg by mouth 2 (two) times daily. X 14 days. Started on 09/26/2013.     ibuprofen 600 MG tablet  Commonly known as:  ADVIL,MOTRIN  Take 1 tablet (600 mg total) by mouth every 6 (six) hours as needed (mild pain).     metroNIDAZOLE 500 MG tablet  Commonly known as:  FLAGYL  Take 500 mg by mouth 3 (three) times daily. X 17 days. Started on 09/18/2013        Please HPI for pertinent positives, otherwise complete 10 system ROS negative.  Physical Exam: BP 142/84  Pulse 62  Temp(Src) 97.9 F (36.6 C) (Oral)  Resp 18  Ht '5\' 5"'  (1.651 m)  Wt 196 lb (88.905 kg)  BMI 32.62 kg/m2  SpO2 100%  LMP 09/24/2013 Body mass index is 32.62 kg/(m^2).   General Appearance:  Alert, cooperative, no distress, appears stated age  Head:  Normocephalic, without obvious abnormality, atraumatic  ENT: Unremarkable  Neck: Supple, symmetrical, trachea midline  Lungs:   Clear to auscultation bilaterally, no w/r/r, respirations unlabored without use of accessory  muscles.  Chest Wall:  No tenderness or deformity  Heart:  Regular rate and rhythm, S1, S2 normal, no murmur, rub or gallop.  Abdomen:   Soft, non distended. Lower abd tenderness.  Extremities: Extremities normal, atraumatic, no cyanosis or edema  Pulses: 2+ and symmetric  Neurologic: Normal affect, no gross deficits.   Results for orders placed during the hospital encounter of 10/04/13 (from the past 48 hour(s))  URINALYSIS, ROUTINE W REFLEX MICROSCOPIC     Status: Abnormal   Collection Time    10/04/13  5:45 PM      Result Value Range   Color, Urine YELLOW  YELLOW   APPearance HAZY (*) CLEAR   Specific Gravity, Urine 1.025  1.005 - 1.030   pH 6.5  5.0 - 8.0   Glucose, UA NEGATIVE  NEGATIVE mg/dL   Hgb urine dipstick NEGATIVE  NEGATIVE   Bilirubin Urine NEGATIVE  NEGATIVE   Ketones, ur NEGATIVE  NEGATIVE mg/dL   Protein, ur NEGATIVE  NEGATIVE mg/dL   Urobilinogen, UA 0.2  0.0 - 1.0 mg/dL   Nitrite NEGATIVE  NEGATIVE   Leukocytes, UA TRACE (*) NEGATIVE  URINE MICROSCOPIC-ADD ON     Status: Abnormal   Collection Time    10/04/13  5:45 PM      Result Value Range   Squamous Epithelial /  LPF MANY (*) RARE   WBC, UA 0-2  <3 WBC/hpf   RBC / HPF 7-10  <3 RBC/hpf   Bacteria, UA RARE  RARE   Urine-Other RARE YEAST    POCT PREGNANCY, URINE     Status: None   Collection Time    10/04/13  5:54 PM      Result Value Range   Preg Test, Ur NEGATIVE  NEGATIVE   Comment:            THE SENSITIVITY OF THIS     METHODOLOGY IS >24 mIU/mL  CBC     Status: Abnormal   Collection Time    10/04/13  6:21 PM      Result Value Range   WBC 7.0  4.0 - 10.5 K/uL   RBC 4.17  3.87 - 5.11 MIL/uL   Hemoglobin 10.9 (*) 12.0 - 15.0 g/dL   HCT 33.3 (*) 36.0 - 46.0 %   MCV 79.9  78.0 - 100.0 fL   MCH 26.1  26.0 - 34.0 pg   MCHC 32.7  30.0 - 36.0 g/dL   RDW 21.6 (*) 11.5 - 15.5 %   Platelets 461 (*) 150 - 400 K/uL  WET PREP, GENITAL     Status: None   Collection Time    10/04/13  7:05 PM      Result  Value Range   Yeast Wet Prep HPF POC NONE SEEN  NONE SEEN   Trich, Wet Prep NONE SEEN  NONE SEEN   Clue Cells Wet Prep HPF POC NONE SEEN  NONE SEEN   WBC, Wet Prep HPF POC NONE SEEN  NONE SEEN   Comment: FEW BACTERIA SEEN  COMPREHENSIVE METABOLIC PANEL     Status: Abnormal   Collection Time    10/05/13 12:59 AM      Result Value Range   Sodium 137  137 - 147 mEq/L   Potassium 3.5 (*) 3.7 - 5.3 mEq/L   Chloride 101  96 - 112 mEq/L   CO2 25  19 - 32 mEq/L   Glucose, Bld 129 (*) 70 - 99 mg/dL   BUN 9  6 - 23 mg/dL   Creatinine, Ser 0.59  0.50 - 1.10 mg/dL   Calcium 8.6  8.4 - 10.5 mg/dL   Total Protein 6.0  6.0 - 8.3 g/dL   Albumin 2.9 (*) 3.5 - 5.2 g/dL   AST 13  0 - 37 U/L   ALT 12  0 - 35 U/L   Alkaline Phosphatase 60  39 - 117 U/L   Total Bilirubin 0.3  0.3 - 1.2 mg/dL   GFR calc non Af Amer >90  >90 mL/min   GFR calc Af Amer >90  >90 mL/min   Comment: (NOTE)     The eGFR has been calculated using the CKD EPI equation.     This calculation has not been validated in all clinical situations.     eGFR's persistently <90 mL/min signify possible Chronic Kidney     Disease.  CBC     Status: Abnormal   Collection Time    10/05/13 12:59 AM      Result Value Range   WBC 6.0  4.0 - 10.5 K/uL   RBC 3.92  3.87 - 5.11 MIL/uL   Hemoglobin 10.2 (*) 12.0 - 15.0 g/dL   HCT 31.3 (*) 36.0 - 46.0 %   MCV 79.8  78.0 - 100.0 fL   MCH 26.0  26.0 - 34.0 pg  MCHC 32.6  30.0 - 36.0 g/dL   RDW 21.4 (*) 11.5 - 15.5 %   Platelets 409 (*) 150 - 400 K/uL  PREGNANCY, URINE     Status: None   Collection Time    10/05/13  6:17 AM      Result Value Range   Preg Test, Ur NEGATIVE  NEGATIVE   Comment:            THE SENSITIVITY OF THIS     METHODOLOGY IS >20 mIU/mL.   US Transvaginal Non-ob  10/04/2013   CLINICAL DATA:  Pelvic inflammatory disease. Follow-up of ultrasound of 09/24/2013. Hydrosalpinx. Cystic lesion of the left ovary.  EXAM: TRANSABDOMINAL AND TRANSVAGINAL ULTRASOUND OF PELVIS   TECHNIQUE: Both transabdominal and transvaginal ultrasound examinations of the pelvis were performed. Transabdominal technique was performed for global imaging of the pelvis including uterus, ovaries, adnexal regions, and pelvic cul-de-sac. It was necessary to proceed with endovaginal exam following the transabdominal exam to visualize the uterus, endometrium, ovaries, and adnexa.  COMPARISON:  Ultrasound dated 09/24/2013  FINDINGS: Uterus  Measurements: 10.7 x 5.1 x 6.4 cm. No fibroids or other mass visualized.  Endometrium  Thickness: 14.8 mm.  No focal abnormality visualized.  Right ovary  Measurements: There is a large pyosalpinx measuring 10.4 x 6.6 x 6.0 cm with complex fluid within the area. The ovary is not well seen separate from the pyosalpinx.  Left ovary  Measurements: 4.6 x 4.0 x 2.5 cm. There are 2 simple cysts, the largest being 2.9 cm in size, diminished since the prior exam. The smaller cyst is 1.8 cm in size.  Other findings  No free fluid.  IMPRESSION: There has been a slight increase in size of the complex right hydrosalpinx.  The dominant cyst in the left ovary on the prior study has significantly diminished in size to 2.9 cm.   Electronically Signed   By: Rozetta Nunnery M.D.   On: 10/04/2013 21:17   US Pelvis Complete  10/04/2013   CLINICAL DATA:  Pelvic inflammatory disease. Follow-up of ultrasound of 09/24/2013. Hydrosalpinx. Cystic lesion of the left ovary.  EXAM: TRANSABDOMINAL AND TRANSVAGINAL ULTRASOUND OF PELVIS  TECHNIQUE: Both transabdominal and transvaginal ultrasound examinations of the pelvis were performed. Transabdominal technique was performed for global imaging of the pelvis including uterus, ovaries, adnexal regions, and pelvic cul-de-sac. It was necessary to proceed with endovaginal exam following the transabdominal exam to visualize the uterus, endometrium, ovaries, and adnexa.  COMPARISON:  Ultrasound dated 09/24/2013  FINDINGS: Uterus  Measurements: 10.7 x 5.1 x 6.4 cm. No  fibroids or other mass visualized.  Endometrium  Thickness: 14.8 mm.  No focal abnormality visualized.  Right ovary  Measurements: There is a large pyosalpinx measuring 10.4 x 6.6 x 6.0 cm with complex fluid within the area. The ovary is not well seen separate from the pyosalpinx.  Left ovary  Measurements: 4.6 x 4.0 x 2.5 cm. There are 2 simple cysts, the largest being 2.9 cm in size, diminished since the prior exam. The smaller cyst is 1.8 cm in size.  Other findings  No free fluid.  IMPRESSION: There has been a slight increase in size of the complex right hydrosalpinx.  The dominant cyst in the left ovary on the prior study has significantly diminished in size to 2.9 cm.   Electronically Signed   By: Rozetta Nunnery M.D.   On: 10/04/2013 21:17   Ct Abdomen Pelvis W Contrast  10/05/2013   CLINICAL DATA:  PID, pelvic pain,  evaluate for possible IR drainage  EXAM: CT ABDOMEN AND PELVIS WITH CONTRAST  TECHNIQUE: Multidetector CT imaging of the abdomen and pelvis was performed using the standard protocol following bolus administration of intravenous contrast.  CONTRAST:  160m OMNIPAQUE IOHEXOL 300 MG/ML  SOLN  COMPARISON:  09/18/2013  FINDINGS: Motion degraded images.  Mild linear scarring at the anterior right lung base.  Liver, spleen, pancreas, and adrenal glands are within normal limits.  Gallbladder is unremarkable. No intrahepatic or extrahepatic ductal dilatation.  10 mm right lower pole renal cyst. Left kidney is within normal limits. No hydronephrosis.  No evidence of bowel obstruction.  Normal appendix.  No evidence of abdominal aortic aneurysm.  Numerous small retroperitoneal nodes measuring up to 9 mm short axis (series 2/ image 48), likely reactive.  Uterus is displaced to the left. Normal left ovary with dominant 2.4 cm left ovarian cyst/ follicle.  9.5 x 6.4 x 7.1 cm complex/multiloculated right adnexal mass, increased, compatible with pyosalpinx/PID. This previously measured 8.1 x 4.5 x 5.1 cm.  Specifically, there has been progression along the right superior aspect of the lesion, with a dominant loculation measuring 5.2 x 4.0 cm (series 2/image 62).  Associated trace right pelvic ascites (series 2/ image 69).  Bladder is notable for mild wall calcifications (series 2/image 73) but is otherwise within normal limits.  Degenerative changes of the lower thoracic spine.  IMPRESSION: 9.5 x 6.4 x 7.1 cm complex/multiloculated right adnexal mass, compatible with pyosalpinx/PID, increased.  Adjacent appendix appears uninvolved.  Associated trace pelvic ascites and mild reactive retroperitoneal lymphadenopathy.   Electronically Signed   By: SJulian HyM.D.   On: 10/05/2013 09:52    Assessment/Plan Right adnexal pelvic abscess For CT guided drainage. Explained procedure, risks, complications, use of sedation. Labs reviewed. Consent signed in chart  BAscencion DikePA-C 10/05/2013, 1:29 PM

## 2013-10-05 NOTE — Progress Notes (Signed)
CT scan reviewed by Dr. Fredia SorrowYamagata and [facility]  Kristin PhenixJames G Eddi Hymes, MD 10/05/2013 11:17 AM

## 2013-10-05 NOTE — MAU Provider Note (Signed)
Agree with admission as in note  Kristin PhenixJames G Arnold, MD 10/05/2013

## 2013-10-05 NOTE — Consult Note (Signed)
Agree 

## 2013-10-05 NOTE — Progress Notes (Signed)
  Subjective: Patient reports tolerating PO, + flatus and no problems voiding.  Pt had significant pain 2 days ago and came to ED yesterday.  Pain is better now.  Objective: I have reviewed patient's vital signs, medications, labs and radiology results.  Resp: clear to auscultation bilaterally GI: soft, tender in RLQ, increased from discharge last week Extremities: no edema, redness or tenderness in the calves or thighs Vaginal Bleeding: none  Assessment: 43 yo female with enlarging pyosalpinx and increasing pain  Plan: Continue antibiotics CT scan this a.m. Will have IR review case and see if pyosalpinx can be drained  LOS: 1 day    LEGGETT,KELLY H. 10/05/2013, 6:46 AM

## 2013-10-05 NOTE — Progress Notes (Signed)
Received pt from radiology post pelvic drain insertion at 1525.  Pt has 2 JP drains to rt groin area. Dressings are dry and intact to rt groin area. One has small serosaguinous fluid in it and the other has a moderate amt. milky pinkish fluid in it.  Was going to empty both drains, but Radiology nurse states Dr.Yamagata did not want them emptied because he [facility] to see the drainage from each.  About 10min after receiving pt, [facility] showed up to transport [facility].  [facility] and pt left short stay depart.at 1545.  Radiology nurse, Ellison CarwinMark B.stated he had already called report [facility].

## 2013-10-05 NOTE — Procedures (Signed)
Procedure:  CT guided drainage of right pyosalpinx Findings:  2 separate 10 Fr drains placed in different pockets of the loculated right adnexal pyosalpinx.  Attached to suction bulbs.

## 2013-10-05 NOTE — Progress Notes (Signed)
Pt sitting on the side of the bed with nurse assistance to go to the bathroom. Pt slid off of the side of the bed into the floor and urinated in the floor. This is the third attempt to get pt up to ambulate to the bathroom. Pt came back from IR at 1630. Pt has been lethargic ever since she returned. I have not given pt any pain medicine. Vitals after the incidence were 100/56, heart rate 70 and respirations 16 and SPO2 97.

## 2013-10-06 MED ORDER — DOXYCYCLINE HYCLATE 100 MG PO TABS
100.0000 mg | ORAL_TABLET | Freq: Two times a day (BID) | ORAL | Status: DC
  Administered 2013-10-06 – 2013-10-08 (×5): 100 mg via ORAL
  Filled 2013-10-06 (×5): qty 1

## 2013-10-06 NOTE — Progress Notes (Signed)
Subjective:was dizzy after returning from IR yesterday, better now Patient reports tolerating PO and no problems voiding.  RLQ sore  Objective: I have reviewed patient's vital signs and medications.  General: alert, cooperative and no distress GI: soft, non-tender; bowel sounds normal; no masses,  no organomegaly Extremities: extremities normal, atraumatic, no cyanosis or edema   Assessment/Plan: POD 1 after IR drainage of right pelvic abscess, will continue antibiotics   LOS: 2 days    Kristin [name] 10/06/2013, 9:14 AM

## 2013-10-07 NOTE — Progress Notes (Addendum)
Hosp Day 3,  Day 2 s/p IR insertion of 2 rlq percutaneous drains into right Pyosalpinx/TOA Subjective: Patient reports tolerating PO, + flatus and no problems voiding.  Pain controlled by Ibuprofen,   Objective: I have reviewed patient's vital signs, intake and output, medications, labs and microbiology. Aerobic and anaerobic cultures negative at 48 hrs. Wbc normal since admit. Temp:  [98.1 F (36.7 C)-98.9 F (37.2 C)] 98.1 F (36.7 C) (01/25 0543) Pulse Rate:  [73-82] 73 (01/25 0543) Resp:  [16-18] 16 (01/25 0543) BP: (105-124)/(63-85) 105/63 mmHg (01/25 0543) SpO2:  [98 %-100 %] 98 % (01/25 0543)   General: alert, cooperative and no distress Resp: clear to auscultation bilaterally GI: soft, non-tender; bowel sounds normal; no masses,  no organomegaly, incision: clean, dry, intact and dry dressing over RLQ drains. and no guarding or rebound. mild rlq tenderness , felt by pt to be superficial.   Assessment/Plan: Hosp day 3  For pyosalpinx/TOA, afebrile siince admit. Readmission after increased pain when on PO cipro/flagyl Plan Continue IV CEfoxitin and PO doxycycline,     Consider d/c in am, will d/c on Doxycycline and Augmentin.  LOS: 3 days    Oryn Casanova V 10/07/2013, 9:59 AM

## 2013-10-08 LAB — CULTURE, ROUTINE-ABSCESS
Culture: NO GROWTH
SPECIAL REQUESTS: NORMAL

## 2013-10-08 MED ORDER — IBUPROFEN 800 MG PO TABS: 800.0000 mg | ORAL_TABLET | Freq: Three times a day (TID) | ORAL | Status: DC | PRN

## 2013-10-08 MED ORDER — DOXYCYCLINE HYCLATE 100 MG PO TABS: 100.0000 mg | ORAL_TABLET | Freq: Two times a day (BID) | ORAL | Status: DC

## 2013-10-08 MED ORDER — METRONIDAZOLE 500 MG PO TABS: 500.0000 mg | ORAL_TABLET | Freq: Three times a day (TID) | ORAL | Status: DC

## 2013-10-08 MED ORDER — OXYCODONE-ACETAMINOPHEN 5-325 MG PO TABS: 1.0000 | ORAL_TABLET | ORAL | Status: DC | PRN

## 2013-10-08 NOTE — Discharge Summary (Signed)
Physician Discharge Summary  Patient ID: Kristin [name] MRN: [id number] DOB/AGE: 45/09/1970 45 y.o.  Admit date: 10/04/2013 Discharge date: 10/08/2013  Admission Diagnoses: right TOA   Discharge Diagnoses: same Active Problems:   Right pyosalpinx   Discharged Condition: good  Hospital Course: She was admitted and started on IV ABX. IR placed 2 drains which have drained about 30 cc per day. Clinically she is greatly improved. She has remained afebrile for 72 hours and is ready for discharge home. I spoke with the radiologist and he wants her to have a follow up CT in 2 days at which time one of the radiologists will determine the timing of the drains removal.  Consults: IR  Significant Diagnostic Studies: CT as above  Treatments: antibiotics: Zosyn  Discharge Exam: Blood pressure 126/84, pulse 68, temperature 98.2 F (36.8 C), temperature source Oral, resp. rate 18, height 5\' 5"  (1.651 m), weight 88.905 kg (196 lb), last menstrual period 09/24/2013, SpO2 96.00%. General appearance: alert Resp: clear to auscultation bilaterally Cardio: regular rate and rhythm, S1, S2 normal, no murmur, click, rub or gallop GI: soft, non-tender; bowel sounds normal; no masses,  no organomegaly  Disposition: 01-Home or Self Care   Future Appointments Provider Department Dept Phone   10/10/2013 2:30 PM Adam PhenixJames G Arnold, MD [facility] [phone]       Medication List    STOP taking these medications       ciprofloxacin 500 MG tablet  Commonly known as:  CIPRO      TAKE these medications       doxycycline 100 MG tablet  Commonly known as:  VIBRA-TABS  Take 1 tablet (100 mg total) by mouth every 12 (twelve) hours.     ibuprofen 600 MG tablet  Commonly known as:  ADVIL,MOTRIN  Take 1 tablet (600 mg total) by mouth every 6 (six) hours as needed (mild pain).     ibuprofen 800 MG tablet  Commonly known as:  ADVIL,MOTRIN  Take 1 tablet (800 mg total) by mouth every 8 (eight) hours  as needed.     metroNIDAZOLE 500 MG tablet  Commonly known as:  FLAGYL  Take 500 mg by mouth 3 (three) times daily. X 17 days. Started on 09/18/2013     metroNIDAZOLE 500 MG tablet  Commonly known as:  FLAGYL  Take 1 tablet (500 mg total) by mouth 3 (three) times daily. 7 days. Started on 09/18/2013     oxyCODONE-acetaminophen 5-325 MG per tablet  Commonly known as:  PERCOCET/ROXICET  Take 1 tablet by mouth every 4 (four) hours as needed for severe pain.           Follow-up Information   Follow up with [facility] On 10/10/2013. ([address])    Contact information:   [place]       [facility] On 11/19/2013. (1pm is her appt time)    Contact information:   [address] [place] [id number] [phone]      Signed: Allie BossierDOVE,Daimion Adamcik C. 10/08/2013, 10:22 AM

## 2013-10-08 NOTE — Progress Notes (Signed)
Pt discharged home with husband... Discharge instructions given, and pt taught how to empty and measure and record drainage from JP drain for follow up appointment on Wednesday... Condition stable... No equipment... Taken to car via wheel chair with Stevphen MeuseV. Pittman, NT.

## 2013-10-08 NOTE — H&P (Signed)
History    CSN: [id number]  Arrival date and time: 10/04/13 1725  None  Chief Complaint   Patient presents with   .  Abdominal Pain   .  Headache   .  Emesis    HPI  Kristin [name] is a 43 yo G5P5 who presents with abdominal pain since 0100 hrs yesterday morning. States the pain is 7/10, constant, sharp, and has no alleviating factors. Pt reports associated vomiting (x3) immediately after trying to eat. She also endorses a HA, 10/10, all around her head with pressure felt behind her eyes. She denies fever, sore throat, cough, diarrhea and vaginal discharge and/or bleeding.  She was admitted on 1/12 at [facility] for PID that was not responding to outpatient treatment. She showed improvement with two days of Cefoxitin and discharged with a prescription for Cipro and Flagyl on 1/14. A transvaginal US showed hydrosalpinx.  OB History    Grav  Para  Term  Preterm  Abortions  TAB  SAB  Ect  Mult  Living    5  5         5       Past Medical History   Diagnosis  Date   .  Anemia    .  PID (pelvic inflammatory disease)     Past Surgical History   Procedure  Laterality  Date   .  No past surgeries      Family History   Problem  Relation  Age of Onset   .  Other  Neg Hx     History   Substance Use Topics   .  Smoking status:  Never Smoker   .  Smokeless tobacco:  Not on file   .  Alcohol Use:  No    Allergies:  Allergies   Allergen  Reactions   .  Zosyn [Piperacillin Sod-Tazobactam So]  Itching     Pt reports itching when receiving zosyn    Prescriptions prior to admission   Medication  Sig  Dispense  Refill   .  ciprofloxacin (CIPRO) 500 MG tablet  Take 500 mg by mouth 2 (two) times daily. X 14 days. Started on 09/26/2013.     .  ibuprofen (ADVIL,MOTRIN) 600 MG tablet  Take 1 tablet (600 mg total) by mouth every 6 (six) hours as needed (mild pain).  30 tablet  0   .  metroNIDAZOLE (FLAGYL) 500 MG tablet  Take 500 mg by mouth 3 (three) times daily. X 17 days. Started on 09/18/2013     .   [DISCONTINUED] ciprofloxacin (CIPRO) 500 MG tablet  Take 1 tablet (500 mg total) by mouth 2 (two) times daily.  28 tablet  0   .  [DISCONTINUED] metroNIDAZOLE (FLAGYL) 500 MG tablet  Take 1 tablet (500 mg total) by mouth 3 (three) times daily. 7 days. Started on 09/18/2013  30 tablet  0    Review of Systems  Constitutional: Negative for fever and chills.  HENT: Negative for sore throat.  Eyes: Negative for blurred vision.  Respiratory: Negative for cough.  Cardiovascular: Negative for chest pain.  Gastrointestinal: Positive for nausea, vomiting and abdominal pain. Negative for diarrhea.  Genitourinary: Negative for dysuria, urgency and frequency.  No vaginal bleeding or discharge  Neurological: Positive for headaches. Negative for dizziness and weakness.   Physical Exam   Blood pressure 133/87, pulse 73, temperature 98.5 F (36.9 C), temperature source Oral, resp. rate 16, last menstrual period 09/24/2013, SpO2 100.00%.  Physical Exam  Constitutional:  She is oriented to person, place, and time. She appears well-developed and well-nourished. No distress.  HENT:  Head: Normocephalic.  Cardiovascular: Normal rate and normal heart sounds.  Respiratory: Breath sounds normal.  GI: Soft. There is tenderness.  Genitourinary:  Speculum exam:  Vagina: Scant creamy, white discharge noted. No blood noted.  Cervix: No erythema, no lesions noted.  Bimanual exam:  Uterus: No tenderness. Cervix: No CMT Adnexa: R sided tenderness and mass noted. L side non tender.  Wet prep obtained. Chaperone present for exam.  Neurological: She is alert and oriented to person, place, and time.  Psychiatric: She has a normal mood and affect. Her behavior is normal.   Complete Pelvic US (1/12)  CLINICAL DATA: Right adnexal pain  EXAM:  TRANSABDOMINAL AND TRANSVAGINAL ULTRASOUND OF PELVIS  TECHNIQUE:  Both transabdominal and transvaginal ultrasound examinations of the  pelvis were performed. Transabdominal  technique was performed for  global imaging of the pelvis including uterus, ovaries, adnexal  regions, and pelvic cul-de-sac. It was necessary to proceed with  endovaginal exam following the transabdominal exam to visualize the  adnexa.  COMPARISON: 09/18/2013  FINDINGS: Uterus  Measurements: 14.0 x 5.3 x 6.5 cm. No fibroids or other mass visualized.  Endometrium Thickness: 15 mm in thickness. The endometrium is heterogeneous.  Right ovary Measurements: 4.1 x 2.7 x 3.9 cm. 2.3 x 1.7 x 3.5 cm simple cysts within the right ovary. Serpiginous echogenic fluid-filled serpiginous structure in the right adnexa has subjectively enlarged.  Left ovary Measurements: 4.5 x 4.0 x 5.1 cm. Left adnexal cystic lesion has  increased in size from 2.7 cm to 4.3 cm in maximal diameter.  Other findings  No free fluid.  IMPRESSION:  Right adnexal hydrosalpinx is subjectively worse and larger.  Echogenic fluid suggests debris and possibly pyosalpinx. Correlate  clinically. Cystic lesion in the left ovary is larger with a maximal diameter of  4.3 cm. Continued short-term follow-up ultrasound in 6-12 weeks is  recommended to ensure resolution. 3.5 cm cystic lesion in the right ovary is also noted.  Followup is reiterated.  Electronically Signed  By: Maryclare Bean M.D.  On: 09/24/2013 21:13  MAU Course   Procedures  MDM  Transvaginal US  Dilaudid  Zofran  Urinalysis  CBC  Assessment and Plan   Abdominal pain  S/P inpatient Tx for PID on 09/24/13  U/S pending  Dilaudid 1 mg IM and Zofran 8 mg ODT given in MAU  Toilolo, Tifi  10/04/2013, 6:29 PM  I have seen this patient and agree with the above PA student's note.  LEFTWICH-KIRBY, LISA  Certified Nurse-Midwife  Report to [place], CNM with ultrasound results pending  US Transvaginal Non-ob  10/04/2013   CLINICAL DATA:  Pelvic inflammatory disease. Follow-up of ultrasound of 09/24/2013. Hydrosalpinx. Cystic lesion of the left ovary.  EXAM:  TRANSABDOMINAL AND TRANSVAGINAL ULTRASOUND OF PELVIS  TECHNIQUE: Both transabdominal and transvaginal ultrasound examinations of the pelvis were performed. Transabdominal technique was performed for global imaging of the pelvis including uterus, ovaries, adnexal regions, and pelvic cul-de-sac. It was necessary to proceed with endovaginal exam following the transabdominal exam to visualize the uterus, endometrium, ovaries, and adnexa.  COMPARISON:  Ultrasound dated 09/24/2013  FINDINGS: Uterus  Measurements: 10.7 x 5.1 x 6.4 cm. No fibroids or other mass visualized.  Endometrium  Thickness: 14.8 mm.  No focal abnormality visualized.  Right ovary  Measurements: There is a large pyosalpinx measuring 10.4 x 6.6 x 6.0 cm with complex fluid within the area. The  ovary is not well seen separate from the pyosalpinx.  Left ovary  Measurements: 4.6 x 4.0 x 2.5 cm. There are 2 simple cysts, the largest being 2.9 cm in size, diminished since the prior exam. The smaller cyst is 1.8 cm in size.  Other findings  No free fluid.  IMPRESSION: There has been a slight increase in size of the complex right hydrosalpinx.  The dominant cyst in the left ovary on the prior study has significantly diminished in size to 2.9 cm.   Electronically Signed   By: Geanie CooleyJim  Maxwell M.D.   On: 10/04/2013 21:17   ASSESSMENT: 1. Right pyosalpinx-enlarged from previous US   PLAN: Admit to third floor for IV ABX per consult w/ Dr. Penne LashLeggett. CT in am Will consult IR RE: possible drainage.  [place] Jaclynne Baldo, [place] 10/08/2013 9:33 PM

## 2013-10-08 NOTE — Discharge Instructions (Signed)
Abscess An abscess is an infected area that contains a collection of pus and debris.It can occur in almost any part of the body. An abscess is also known as a furuncle or boil. CAUSES  An abscess occurs when tissue gets infected. This can occur from blockage of oil or sweat glands, infection of hair follicles, or a minor injury to the skin. As the body tries to fight the infection, pus collects in the area and creates pressure under the skin. This pressure causes pain. People with weakened immune systems have difficulty fighting infections and get certain abscesses more often.  SYMPTOMS Usually an abscess develops on the skin and becomes a painful mass that is red, warm, and tender. If the abscess forms under the skin, you may feel a moveable soft area under the skin. Some abscesses break open (rupture) on their own, but most will continue to get worse without care. The infection can spread deeper into the body and eventually into the bloodstream, causing you to feel ill.  DIAGNOSIS  Your caregiver will take your medical history and perform a physical exam. A sample of fluid may also be taken from the abscess to determine what is causing your infection. TREATMENT  Your caregiver may prescribe antibiotic medicines to fight the infection. However, taking antibiotics alone usually does not cure an abscess. Your caregiver may need to make a small cut (incision) in the abscess to drain the pus. In some cases, gauze is packed into the abscess to reduce pain and to continue draining the area. HOME CARE INSTRUCTIONS   Only take over-the-counter or prescription medicines for pain, discomfort, or fever as directed by your caregiver.  If you were prescribed antibiotics, take them as directed. Finish them even if you start to feel better.  If gauze is used, follow your caregiver's directions for changing the gauze.  To avoid spreading the infection:  Keep your draining abscess covered with a  bandage.  Wash your hands well.  Do not share personal care items, towels, or whirlpools with others.  Avoid skin contact with others.  Keep your skin and clothes clean around the abscess.  Keep all follow-up appointments as directed by your caregiver. SEEK MEDICAL CARE IF:   You have increased pain, swelling, redness, fluid drainage, or bleeding.  You have muscle aches, chills, or a general ill feeling.  You have a fever. MAKE SURE YOU:   Understand these instructions.  Will watch your condition.  Will get help right away if you are not doing well or get worse. Document Released: 06/09/2005 Document Revised: 02/29/2012 Document Reviewed: 11/12/2011 ExitCare Patient Information 2014 [employer].  

## 2013-10-09 ENCOUNTER — Other Ambulatory Visit (HOSPITAL_COMMUNITY): Payer: Self-pay | Admitting: Obstetrics & Gynecology

## 2013-10-09 DIAGNOSIS — L0291 Cutaneous abscess, unspecified: Secondary | ICD-10-CM

## 2013-10-10 ENCOUNTER — Inpatient Hospital Stay: Admission: RE | Admit: 2013-10-10 | Payer: No Typology Code available for payment source | Source: Ambulatory Visit

## 2013-10-10 ENCOUNTER — Other Ambulatory Visit: Payer: No Typology Code available for payment source

## 2013-10-10 ENCOUNTER — Ambulatory Visit: Payer: No Typology Code available for payment source | Admitting: Obstetrics & Gynecology

## 2013-10-10 LAB — ANAEROBIC CULTURE: Special Requests: NORMAL

## 2013-10-12 NOTE — H&P (Signed)
Pt seen and examined.  Agree with above note.  Attestation of Attending Supervision of Advanced Practitioner (CNM/NP): Evaluation and management procedures were performed by the Advanced Practitioner under my supervision and collaboration. I have reviewed the Advanced Practitioner's note and chart, and I agree with the management and plan.  LEGGETT,KELLY H. 4:56 PM

## 2013-10-16 ENCOUNTER — Ambulatory Visit
Admission: RE | Admit: 2013-10-16 | Discharge: 2013-10-16 | Disposition: A | Discharge status: Home / Self Care | Payer: 59 | Source: Ambulatory Visit | Attending: Obstetrics & Gynecology | Admitting: Obstetrics & Gynecology

## 2013-10-16 DIAGNOSIS — N739 Female pelvic inflammatory disease, unspecified: Secondary | ICD-10-CM

## 2013-10-16 DIAGNOSIS — L0291 Cutaneous abscess, unspecified: Secondary | ICD-10-CM

## 2013-10-16 MED ORDER — IOHEXOL 300 MG/ML  SOLN
100.0000 mL | Freq: Once | INTRAMUSCULAR | Status: AC | PRN
  Administered 2013-10-16: 100 mL via INTRAVENOUS

## 2013-10-22 ENCOUNTER — Ambulatory Visit (INDEPENDENT_AMBULATORY_CARE_PROVIDER_SITE_OTHER): Payer: No Typology Code available for payment source | Admitting: Obstetrics & Gynecology

## 2013-10-22 ENCOUNTER — Encounter: Payer: Self-pay | Admitting: Obstetrics & Gynecology

## 2013-10-22 VITALS — BP 134/96 | HR 68 | Temp 97.8°F | Ht 65.0 in | Wt 188.0 lb

## 2013-10-22 DIAGNOSIS — N730 Acute parametritis and pelvic cellulitis: Secondary | ICD-10-CM

## 2013-10-22 DIAGNOSIS — K143 Hypertrophy of tongue papillae: Secondary | ICD-10-CM | POA: Insufficient documentation

## 2013-10-22 MED ORDER — MAGIC MOUTHWASH: 5.0000 mL | Freq: Three times a day (TID) | ORAL | Status: DC

## 2013-10-22 NOTE — Progress Notes (Signed)
Pt states she does not need interpreter. Pt reports rash in her mouth.

## 2013-10-22 NOTE — Progress Notes (Signed)
Subjective:     Patient ID: Kristin [name], female   DOB: 11/25/1970, 43 y.o.   MRN: [id number]  HPIG5P5 Patient's last menstrual period was 10/22/2013. Patient had IR drainage of pelvic abscess in January, has finished antibiotics, no pain, fever   Review of Systems  Constitutional: Negative for fever.  HENT:       Coated tongue with some irritation  Gastrointestinal: Negative for abdominal pain.  Genitourinary: Negative for vaginal bleeding, vaginal discharge, menstrual problem and pelvic pain.       Objective:   Physical Exam  Constitutional: She appears well-developed. No distress.  Abdominal: Soft. She exhibits no distension and no mass. There is no tenderness.  Drain site healing  Skin: Skin is warm and dry.  Psychiatric: She has a normal mood and affect. Her behavior is normal.       Assessment:     Doing well after pelvic abscess, coated tongue    Plan:     Magic mouthwash for sx tongue, notify if no help      Adam PhenixJames G Arnold, MD 10/22/2013

## 2013-10-23 ENCOUNTER — Other Ambulatory Visit: Payer: Self-pay | Admitting: *Deleted

## 2013-10-23 DIAGNOSIS — K143 Hypertrophy of tongue papillae: Secondary | ICD-10-CM

## 2013-10-23 MED ORDER — MAGIC MOUTHWASH: 5.0000 mL | Freq: Three times a day (TID) | ORAL | Status: DC

## 2013-10-23 NOTE — Telephone Encounter (Signed)
Pt came into clinic wanting prescription changed to different pharmacy.  Prescription reordered. Pt verbalizes understanding.

## 2013-12-25 ENCOUNTER — Ambulatory Visit (INDEPENDENT_AMBULATORY_CARE_PROVIDER_SITE_OTHER): Payer: No Typology Code available for payment source | Admitting: Medical

## 2013-12-25 ENCOUNTER — Encounter: Payer: Self-pay | Admitting: Medical

## 2013-12-25 VITALS — BP 148/90 | HR 66 | Temp 97.5°F | Resp 16 | Wt 194.0 lb

## 2013-12-25 DIAGNOSIS — K5900 Constipation, unspecified: Secondary | ICD-10-CM

## 2013-12-25 DIAGNOSIS — R5381 Other malaise: Secondary | ICD-10-CM

## 2013-12-25 DIAGNOSIS — R5383 Other fatigue: Principal | ICD-10-CM

## 2013-12-25 DIAGNOSIS — Z8719 Personal history of other diseases of the digestive system: Secondary | ICD-10-CM

## 2013-12-25 DIAGNOSIS — D649 Anemia, unspecified: Secondary | ICD-10-CM

## 2013-12-25 DIAGNOSIS — Z87898 Personal history of other specified conditions: Secondary | ICD-10-CM

## 2013-12-25 LAB — CBC WITH DIFFERENTIAL/PLATELET
Basophils Absolute: 0 10*3/uL (ref 0.0–0.1)
Basophils Relative: 0 % (ref 0–1)
Eosinophils Absolute: 0.1 10*3/uL (ref 0.0–0.7)
Eosinophils Relative: 2 % (ref 0–5)
HEMATOCRIT: 35.1 % — AB (ref 36.0–46.0)
Hemoglobin: 11.7 g/dL — ABNORMAL LOW (ref 12.0–15.0)
LYMPHS PCT: 46 % (ref 12–46)
Lymphs Abs: 2.3 10*3/uL (ref 0.7–4.0)
MCH: 27.4 pg (ref 26.0–34.0)
MCHC: 33.3 g/dL (ref 30.0–36.0)
MCV: 82.2 fL (ref 78.0–100.0)
MONO ABS: 0.5 10*3/uL (ref 0.1–1.0)
MONOS PCT: 9 % (ref 3–12)
Neutro Abs: 2.2 10*3/uL (ref 1.7–7.7)
Neutrophils Relative %: 43 % (ref 43–77)
Platelets: 258 10*3/uL (ref 150–400)
RBC: 4.27 MIL/uL (ref 3.87–5.11)
RDW: 16.1 % — ABNORMAL HIGH (ref 11.5–15.5)
WBC: 5 10*3/uL (ref 4.0–10.5)

## 2013-12-25 LAB — COMPREHENSIVE METABOLIC PANEL
ALBUMIN: 4 g/dL (ref 3.5–5.2)
ALT: 14 U/L (ref 0–35)
AST: 21 U/L (ref 0–37)
Alkaline Phosphatase: 72 U/L (ref 39–117)
BUN: 14 mg/dL (ref 6–23)
CALCIUM: 9 mg/dL (ref 8.4–10.5)
CHLORIDE: 105 meq/L (ref 96–112)
CO2: 23 mEq/L (ref 19–32)
Creat: 0.85 mg/dL (ref 0.50–1.10)
GLUCOSE: 83 mg/dL (ref 70–99)
Potassium: 4.4 mEq/L (ref 3.5–5.3)
Sodium: 137 mEq/L (ref 135–145)
Total Bilirubin: 0.4 mg/dL (ref 0.2–1.2)
Total Protein: 7 g/dL (ref 6.0–8.3)

## 2013-12-25 LAB — IRON AND TIBC
%SAT: 11 % — AB (ref 20–55)
IRON: 37 ug/dL — AB (ref 42–145)
TIBC: 345 ug/dL (ref 250–470)
UIBC: 308 ug/dL (ref 125–400)

## 2013-12-25 MED ORDER — FERROUS GLUCONATE 324 (38 FE) MG PO TABS: 324.0000 mg | ORAL_TABLET | Freq: Three times a day (TID) | ORAL | Status: DC

## 2013-12-25 MED ORDER — LUBIPROSTONE 24 MCG PO CAPS: 24.0000 ug | ORAL_CAPSULE | Freq: Two times a day (BID) | ORAL | Status: DC

## 2013-12-25 MED ORDER — HYDROCORTISONE 2.5 % RE CREA: 1.0000 "application " | TOPICAL_CREAM | Freq: Two times a day (BID) | RECTAL | Status: DC

## 2013-12-25 NOTE — Patient Instructions (Signed)
Thank you for giving me the opportunity to serve you today.    Your diagnosis today includes: Encounter Diagnoses  Name Primary?  . Other malaise and fatigue Yes  . Anemia   . History of hemorrhoids   . Unspecified constipation      Specific recommendations today include:  Begin back on ferrous gluconate/iron twice to 3 times daily for anemia  Begin Amitiza 24 mcg tablet twice daily for constipation  Increase your water intake  You may use Proctosol cream for hemorrhoids topically as needed  Use salt water baths soaks for hemorrhoids  We will call with lab results  Return pending labs.    I have included other useful information below for your review.  Constipation, Adult Constipation is when a person has fewer than 3 bowel movements a week; has difficulty having a bowel movement; or has stools that are dry, hard, or larger than normal. As people grow older, constipation is more common. If you try to fix constipation with medicines that make you have a bowel movement (laxatives), the problem may get worse. Long-term laxative use may cause the muscles of the colon to become weak. A low-fiber diet, not taking in enough fluids, and taking certain medicines may make constipation worse. CAUSES   Certain medicines, such as antidepressants, pain medicine, iron supplements, antacids, and water pills.   Certain diseases, such as diabetes, irritable bowel syndrome (IBS), thyroid disease, or depression.   Not drinking enough water.   Not eating enough fiber-rich foods.   Stress or travel.  Lack of physical activity or exercise.  Not going to the restroom when there is the urge to have a bowel movement.  Ignoring the urge to have a bowel movement.  Using laxatives too much. SYMPTOMS   Having fewer than 3 bowel movements a week.   Straining to have a bowel movement.   Having hard, dry, or larger than normal stools.   Feeling full or bloated.   Pain in the  lower abdomen.  Not feeling relief after having a bowel movement. DIAGNOSIS  Your caregiver will take a medical history and perform a physical exam. Further testing may be done for severe constipation. Some tests may include:   A barium enema X-ray to examine your rectum, colon, and sometimes, your small intestine.  A sigmoidoscopy to examine your lower colon.  A colonoscopy to examine your entire colon. TREATMENT  Treatment will depend on the severity of your constipation and what is causing it. Some dietary treatments include drinking more fluids and eating more fiber-rich foods. Lifestyle treatments may include regular exercise. If these diet and lifestyle recommendations do not help, your caregiver may recommend taking over-the-counter laxative medicines to help you have bowel movements. Prescription medicines may be prescribed if over-the-counter medicines do not work.  HOME CARE INSTRUCTIONS   Increase dietary fiber in your diet, such as fruits, vegetables, whole grains, and beans. Limit high-fat and processed sugars in your diet, such as [place] fries, hamburgers, cookies, candies, and soda.   A fiber supplement may be added to your diet if you cannot get enough fiber from foods.   Drink enough fluids to keep your urine clear or pale yellow.   Exercise regularly or as directed by your caregiver.   Go to the restroom when you have the urge to go. Do not hold it.  Only take medicines as directed by your caregiver. Do not take other medicines for constipation without talking to your caregiver first. [facility] IMMEDIATE MEDICAL  CARE IF:   You have bright red blood in your stool.   Your constipation lasts for more than 4 days or gets worse.   You have abdominal or rectal pain.   You have thin, pencil-like stools.  You have unexplained weight loss. MAKE SURE YOU:   Understand these instructions.  Will watch your condition.  Will get help right away if you are not doing  well or get worse. Document Released: 05/28/2004 Document Revised: 11/22/2011 Document Reviewed: 06/11/2013 [facility] Patient Information 2014 [place], [place].

## 2013-12-25 NOTE — Progress Notes (Signed)
Subjective:  Kristin [name] is a 43 y.o. female who presents for recheck.  [place]     I have seen her before for anemia, but since last visit she has been hospitalized for pelvic pain, complex ovarian cyst, ultimately had drainage tube placed by IR.  Last saw gynecology in 10/2013 in f/u.    Lately been feeling very tired.  jsut ran out of iron, but been taking this regularly.  She feels like her hemoglobin is low.  She has not received a blood transfusion.  She has been taking ferrous gluconate BID.   She notes long hx/o anemia.  She does have hx/o constipation and hemorrhoids, gets blood in stool occasionally.  She was guaiac + in 07/2013 on stool cards turned in, but due to the pelvic issues ended up seeing gynecology.  Has not seen GI.  Uses some fiber supplement occasionally.  No prior prescription medication for constipation. No hx/o thyroid disease.  No other aggravating or relieving factors.  No other c/o.  The following portions of the patient's history were reviewed and updated as appropriate: allergies, current medications, past family history, past medical history, past social history, past surgical history and problem list.  ROS Otherwise as in subjective above  Objective: Physical Exam BP 148/90  Pulse 66  Temp(Src) 97.5 F (36.4 C) (Oral)  Resp 16  Wt 194 lb (87.998 kg)   General appearance: alert, no distress, WD/WN Oral cavity: MMM, no lesions Neck: supple, no lymphadenopathy, no thyromegaly, no masses Heart: RRR, normal S1, S2, no murmurs Lungs: CTA bilaterally, no wheezes, rhonchi, or rales Abdomen: +bs, soft, mild right lower pelvic tenderness, otherwise non tender, non distended, no masses, no hepatomegaly, no splenomegaly Pulses: 2+ radial pulses, 2+ pedal pulses, normal cap refill Ext: no edema DRE: several moderate to large external nontender hemorrhoids, occult negative stool, no other mass, exam chaperoned by  nurse   Assessment: Encounter Diagnoses  Name Primary?  . Other malaise and fatigue Yes  . Anemia   . History of hemorrhoids     Plan: Labs today.  Specific recommendations today include:  Begin back on ferrous gluconate/iron twice to 3 times daily for anemia  Begin Amitiza 24 mcg tablet twice daily for constipation  Increase your water intake  You may use Proctosol cream for hemorrhoids topically as needed  Use salt water baths soaks for hemorrhoids  We will call with lab results  F/u pending labs.

## 2013-12-26 ENCOUNTER — Encounter: Payer: Self-pay | Admitting: Family Medicine

## 2013-12-26 LAB — FERRITIN: Ferritin: 14 ng/mL (ref 10–291)

## 2014-06-03 ENCOUNTER — Other Ambulatory Visit: Payer: Self-pay | Admitting: Medical

## 2014-06-11 ENCOUNTER — Encounter: Payer: Self-pay | Admitting: Medical

## 2014-06-11 ENCOUNTER — Ambulatory Visit (INDEPENDENT_AMBULATORY_CARE_PROVIDER_SITE_OTHER): Payer: Self-pay | Admitting: Medical

## 2014-06-11 VITALS — BP 140/90 | HR 86 | Temp 97.7°F | Resp 16 | Wt 199.0 lb

## 2014-06-11 DIAGNOSIS — E669 Obesity, unspecified: Secondary | ICD-10-CM

## 2014-06-11 DIAGNOSIS — D509 Iron deficiency anemia, unspecified: Secondary | ICD-10-CM

## 2014-06-11 DIAGNOSIS — K5900 Constipation, unspecified: Secondary | ICD-10-CM

## 2014-06-11 DIAGNOSIS — K649 Unspecified hemorrhoids: Secondary | ICD-10-CM

## 2014-06-11 DIAGNOSIS — R030 Elevated blood-pressure reading, without diagnosis of hypertension: Secondary | ICD-10-CM

## 2014-06-11 LAB — POCT HEMOGLOBIN: Hemoglobin: 10.6 g/dL — AB (ref 12.2–16.2)

## 2014-06-11 MED ORDER — FERROUS GLUCONATE 324 (38 FE) MG PO TABS: ORAL_TABLET | ORAL | Status: AC

## 2014-06-11 NOTE — Progress Notes (Signed)
Subjective: Here for recheck on multiple issues.  Here with translator today.    Anemia - compliant with iron BID for last several months.  No reports of bleeding, bruising, has long hx/o iron deficiency.  No prior transfusion.  No blood in stool, periods are regular, not heavy.  Last visit we discussed constipation and hemorrhoids, but both issues have resolved.  No other c/o.   Exercises daily.  States she eats healthy, African food, rice, but also eats french fries, other fried foods, meat and potatoes regularly.    ROS as in subjective  Objective: Filed Vitals:   06/11/14 1505  BP: 140/90  Pulse: 86  Temp: 97.7 F (36.5 C)  Resp: 16    General appearance: alert, no distress, WD/WN Conjunctiva normal Oral cavity: MMM, no lesions Neck: supple, no lymphadenopathy, no thyromegaly, no masses Heart: RRR, normal S1, S2, no murmurs Lungs: CTA bilaterally, no wheezes, rhonchi, or rales Abdomen: +bs, soft, non tender, non distended, no masses, no hepatomegaly, no splenomegaly Pulses: 2+ symmetric, upper and lower extremities, normal cap refill   Assessment:  Encounter Diagnoses  Name Primary?  Marland Kitchen. Anemia, iron deficiency Yes  . Elevated blood pressure reading without diagnosis of hypertension   . Obesity, unspecified   . Unspecified constipation   . Hemorrhoids, unspecified hemorrhoid type    Plan: Anemia - hemoglobin still low but stable, long hx/o iron deficiency.  C/t iron BID, and there was a mix up with insurance, so she is self pay today.  I advised for completeness and to rule out other causes of anemia/iron deficiency, would advise colonoscopy when ready and insured to further eval iron deficiency anemia.    Elevated BP - advised weight loss, avoidance of added salt, healthy diet changes as discussed.  Recheck 57mo.  Obesity - discussed weight loss recommendations, diet, exercise.  constipation and hemorrhoids resolved

## 2014-06-11 NOTE — Patient Instructions (Signed)
Thank you for giving me the opportunity to serve you today.    Your diagnosis today includes: Encounter Diagnoses  Name Primary?  Marland Kitchen Anemia, iron deficiency Yes  . Elevated blood pressure reading without diagnosis of hypertension   . Obesity, unspecified   . Unspecified constipation   . Hemorrhoids, unspecified hemorrhoid type      Specific recommendations today include:  Hemoglobin is relatively stable.  Continue Iron/ferrous gluconate twice daily with food or orange juice  Cut back on salt, french fries, fried food, limit or decrease serving sizes of potatoes and grains such as bread or rice.   For example, use 1/2 cup of rice per meal.  Try and work on losing a little weight  You blood pressure was elevated today and has been elevated and some visits in the past year.   Currently I recommend no added salt in diet and losing weight  If your blood pressure continues to be elevated in the next 6-12 months, we may need to begin blood pressure medication to lower blood pressure and risks of high blood pressure such as heart disease, stroke, eye and kidney damage  Ideally if someone continues to have iron deficiency despite supplementation, we ultimately have gastroenterology do a colonoscopy to rule out bleeding in the colon.  Once you get insurance again or within the next 6 months, we need to recheck the blood counts and consider referral for colonoscopy  Glad to hear constipation and hemorrhoids improved.  Follow up in 6 months unless you obtain insurance sooner.    I have included other useful information below for your review.  Iron Deficiency Anemia Anemia is a condition in which there are less red blood cells or hemoglobin in the blood than normal. Hemoglobin is the part of red blood cells that carries oxygen. Iron deficiency anemia is anemia caused by too little iron. It is the most common type of anemia. It may leave you tired and short of breath. CAUSES   Lack of iron  in the diet.  Poor absorption of iron, as seen with intestinal disorders.  Intestinal bleeding.  Heavy periods. SIGNS AND SYMPTOMS  Mild anemia may not be noticeable. Symptoms may include:  Fatigue.  Headache.  Pale skin.  Weakness.  Tiredness.  Shortness of breath.  Dizziness.  Cold hands and feet.  Fast or irregular heartbeat. DIAGNOSIS  Diagnosis requires a thorough evaluation and physical exam by your health care provider. Blood tests are generally used to confirm iron deficiency anemia. Additional tests may be done to find the underlying cause of your anemia. These may include:  Testing for blood in the stool (fecal occult blood test).  A procedure to see inside the colon and rectum (colonoscopy).  A procedure to see inside the esophagus and stomach (endoscopy). TREATMENT  Iron deficiency anemia is treated by correcting the cause of the deficiency. Treatment may involve:  Adding iron-rich foods to your diet.  Taking iron supplements. Pregnant or breastfeeding women need to take extra iron because their normal diet usually does not provide the required amount.  Taking vitamins. Vitamin C improves the absorption of iron. Your health care provider may recommend that you take your iron tablets with a glass of orange juice or vitamin C supplement.  Medicines to make heavy menstrual flow lighter.  Surgery. HOME CARE INSTRUCTIONS   Take iron as directed by your health care provider.  If you cannot tolerate taking iron supplements by mouth, talk to your health care provider about taking  them through a vein (intravenously) or an injection into a muscle.  For the best iron absorption, iron supplements should be taken on an empty stomach. If you cannot tolerate them on an empty stomach, you may need to take them with food.  Do not drink milk or take antacids at the same time as your iron supplements. Milk and antacids may interfere with the absorption of iron.  Iron  supplements can cause constipation. Make sure to include fiber in your diet to prevent constipation. A stool softener may also be recommended.  Take vitamins as directed by your health care provider.  Eat a diet rich in iron. Foods high in iron include liver, lean beef, whole-grain bread, eggs, dried fruit, and dark green leafy vegetables. SEEK IMMEDIATE MEDICAL CARE IF:   You faint. If this happens, do not drive. Call your local emergency services (911 in [place] if no other help is available.  You have chest pain.  You feel nauseous or vomit.  You have severe or increased shortness of breath with activity.  You feel weak.  You have a rapid heartbeat.  You have unexplained sweating.  You become light-headed when getting up from a chair or bed. MAKE SURE YOU:   Understand these instructions.  Will watch your condition.  Will get help right away if you are not doing well or get worse. Document Released: 08/27/2000 Document Revised: 09/04/2013 Document Reviewed: 05/07/2013 [facility] Patient Information 2015 [employer], [place]. This information is not intended to replace advice given to you by your health care provider. Make sure you discuss any questions you have with your health care provider.    Hypertension Hypertension, commonly called high blood pressure, is when the force of blood pumping through your arteries is too strong. Your arteries are the blood vessels that carry blood from your heart throughout your body. A blood pressure reading consists of a higher number over a lower number, such as 110/72. The higher number (systolic) is the pressure inside your arteries when your heart pumps. The lower number (diastolic) is the pressure inside your arteries when your heart relaxes. Ideally you want your blood pressure below 120/80. Hypertension forces your heart to work harder to pump blood. Your arteries may become narrow or stiff. Having hypertension puts you at risk for heart  disease, stroke, and other problems.  RISK FACTORS Some risk factors for high blood pressure are controllable. Others are not.  Risk factors you cannot control include:   Race. You may be at higher risk if you are African American.  Age. Risk increases with age.  Gender. Men are at higher risk than women before age 45 years. After age 45, women are at higher risk than men. Risk factors you can control include:  Not getting enough exercise or physical activity.  Being overweight.  Getting too much fat, sugar, calories, or salt in your diet.  Drinking too much alcohol. SIGNS AND SYMPTOMS Hypertension does not usually cause signs or symptoms. Extremely high blood pressure (hypertensive crisis) may cause headache, anxiety, shortness of breath, and nosebleed. DIAGNOSIS  To check if you have hypertension, your health care provider will measure your blood pressure while you are seated, with your arm held at the level of your heart. It should be measured at least twice using the same arm. Certain conditions can cause a difference in blood pressure between your right and left arms. A blood pressure reading that is higher than normal on one occasion does not mean that you need  treatment. If one blood pressure reading is high, ask your health care provider about having it checked again. TREATMENT  Treating high blood pressure includes making lifestyle changes and possibly taking medicine. Living a healthy lifestyle can help lower high blood pressure. You may need to change some of your habits. Lifestyle changes may include:  Following the DASH diet. This diet is high in fruits, vegetables, and whole grains. It is low in salt, red meat, and added sugars.  Getting at least 2 hours of brisk physical activity every week.  Losing weight if necessary.  Not smoking.  Limiting alcoholic beverages.  Learning ways to reduce stress. If lifestyle changes are not enough to get your blood pressure  under control, your health care provider may prescribe medicine. You may need to take more than one. Work closely with your health care provider to understand the risks and benefits. HOME CARE INSTRUCTIONS  Have your blood pressure rechecked as directed by your health care provider.   Take medicines only as directed by your health care provider. Follow the directions carefully. Blood pressure medicines must be taken as prescribed. The medicine does not work as well when you skip doses. Skipping doses also puts you at risk for problems.   Do not smoke.   Monitor your blood pressure at home as directed by your health care provider. SEEK MEDICAL CARE IF:   You think you are having a reaction to medicines taken.  You have recurrent headaches or feel dizzy.  You have swelling in your ankles.  You have trouble with your vision. SEEK IMMEDIATE MEDICAL CARE IF:  You develop a severe headache or confusion.  You have unusual weakness, numbness, or feel faint.  You have severe chest or abdominal pain.  You vomit repeatedly.  You have trouble breathing. MAKE SURE YOU:   Understand these instructions.  Will watch your condition.  Will get help right away if you are not doing well or get worse. Document Released: 08/30/2005 Document Revised: 01/14/2014 Document Reviewed: 06/22/2013 [facility] Patient Information 2015 [employer], [place]. This information is not intended to replace advice given to you by your health care provider. Make sure you discuss any questions you have with your health care provider.

## 2014-07-05 ENCOUNTER — Inpatient Hospital Stay (HOSPITAL_COMMUNITY): Payer: 59

## 2014-07-05 ENCOUNTER — Encounter (HOSPITAL_COMMUNITY): Payer: Self-pay

## 2014-07-05 ENCOUNTER — Inpatient Hospital Stay (HOSPITAL_COMMUNITY)
Admission: AD | Admit: 2014-07-05 | Discharge: 2014-07-09 | DRG: 759 | Disposition: A | Discharge status: Home / Self Care | Payer: 59 | Source: Ambulatory Visit | Attending: Obstetrics and Gynecology | Admitting: Obstetrics and Gynecology

## 2014-07-05 DIAGNOSIS — N7013 Chronic salpingitis and oophoritis: Secondary | ICD-10-CM

## 2014-07-05 DIAGNOSIS — K5900 Constipation, unspecified: Secondary | ICD-10-CM | POA: Diagnosis present

## 2014-07-05 DIAGNOSIS — N7001 Acute salpingitis: Secondary | ICD-10-CM

## 2014-07-05 DIAGNOSIS — N7011 Chronic salpingitis: Secondary | ICD-10-CM | POA: Diagnosis present

## 2014-07-05 DIAGNOSIS — N739 Female pelvic inflammatory disease, unspecified: Secondary | ICD-10-CM | POA: Diagnosis present

## 2014-07-05 DIAGNOSIS — Z880 Allergy status to penicillin: Secondary | ICD-10-CM

## 2014-07-05 DIAGNOSIS — D649 Anemia, unspecified: Secondary | ICD-10-CM

## 2014-07-05 DIAGNOSIS — N7093 Salpingitis and oophoritis, unspecified: Secondary | ICD-10-CM | POA: Diagnosis present

## 2014-07-05 DIAGNOSIS — R1031 Right lower quadrant pain: Secondary | ICD-10-CM

## 2014-07-05 HISTORY — DX: Gestational diabetes mellitus in pregnancy, unspecified control: O24.419

## 2014-07-05 HISTORY — DX: Gestational (pregnancy-induced) hypertension without significant proteinuria, unspecified trimester: O13.9

## 2014-07-05 LAB — CBC WITH DIFFERENTIAL/PLATELET
BASOS ABS: 0 10*3/uL (ref 0.0–0.1)
BASOS PCT: 0 % (ref 0–1)
EOS PCT: 0 % (ref 0–5)
Eosinophils Absolute: 0 10*3/uL (ref 0.0–0.7)
HCT: 34.7 % — ABNORMAL LOW (ref 36.0–46.0)
HEMOGLOBIN: 11.5 g/dL — AB (ref 12.0–15.0)
Lymphocytes Relative: 9 % — ABNORMAL LOW (ref 12–46)
Lymphs Abs: 0.6 10*3/uL — ABNORMAL LOW (ref 0.7–4.0)
MCH: 28 pg (ref 26.0–34.0)
MCHC: 33.1 g/dL (ref 30.0–36.0)
MCV: 84.4 fL (ref 78.0–100.0)
MONOS PCT: 0 % — AB (ref 3–12)
Monocytes Absolute: 0 10*3/uL — ABNORMAL LOW (ref 0.1–1.0)
NEUTROS ABS: 5.6 10*3/uL (ref 1.7–7.7)
Neutrophils Relative %: 91 % — ABNORMAL HIGH (ref 43–77)
Platelets: 207 10*3/uL (ref 150–400)
RBC: 4.11 MIL/uL (ref 3.87–5.11)
RDW: 14.4 % (ref 11.5–15.5)
WBC: 6.2 10*3/uL (ref 4.0–10.5)

## 2014-07-05 LAB — COMPREHENSIVE METABOLIC PANEL
ALBUMIN: 3.7 g/dL (ref 3.5–5.2)
ALT: 34 U/L (ref 0–35)
ANION GAP: 14 (ref 5–15)
AST: 52 U/L — AB (ref 0–37)
Alkaline Phosphatase: 109 U/L (ref 39–117)
BILIRUBIN TOTAL: 0.8 mg/dL (ref 0.3–1.2)
BUN: 12 mg/dL (ref 6–23)
CHLORIDE: 100 meq/L (ref 96–112)
CO2: 21 meq/L (ref 19–32)
Calcium: 8.8 mg/dL (ref 8.4–10.5)
Creatinine, Ser: 0.64 mg/dL (ref 0.50–1.10)
GFR calc Af Amer: >90 mL/min (ref >90)
Glucose, Bld: 166 mg/dL — ABNORMAL HIGH (ref 70–99)
Potassium: 3.8 mEq/L (ref 3.7–5.3)
Sodium: 135 mEq/L — ABNORMAL LOW (ref 137–147)
Total Protein: 7.3 g/dL (ref 6.0–8.3)

## 2014-07-05 LAB — URINALYSIS, ROUTINE W REFLEX MICROSCOPIC
BILIRUBIN URINE: NEGATIVE
GLUCOSE, UA: NEGATIVE mg/dL
KETONES UR: NEGATIVE mg/dL
Leukocytes, UA: NEGATIVE
Nitrite: NEGATIVE
PH: 6 (ref 5.0–8.0)
PROTEIN: NEGATIVE mg/dL
Specific Gravity, Urine: >1.03 — ABNORMAL HIGH (ref 1.005–1.030)
Urobilinogen, UA: 0.2 mg/dL (ref 0.0–1.0)

## 2014-07-05 LAB — URINE MICROSCOPIC-ADD ON

## 2014-07-05 LAB — POCT PREGNANCY, URINE: Preg Test, Ur: NEGATIVE

## 2014-07-05 MED ORDER — IBUPROFEN 800 MG PO TABS
800.0000 mg | ORAL_TABLET | Freq: Once | ORAL | Status: AC
  Administered 2014-07-05: 800 mg via ORAL
  Filled 2014-07-05: qty 1

## 2014-07-05 MED ORDER — ONDANSETRON HCL 4 MG/2ML IJ SOLN
4.0000 mg | Freq: Once | INTRAMUSCULAR | Status: AC
  Administered 2014-07-05: 4 mg via INTRAVENOUS
  Filled 2014-07-05: qty 2

## 2014-07-05 MED ORDER — HYDROMORPHONE HCL 1 MG/ML IJ SOLN: 0.2000 mg | INTRAMUSCULAR | Status: DC | PRN

## 2014-07-05 MED ORDER — ONDANSETRON HCL 4 MG PO TABS: 4.0000 mg | ORAL_TABLET | Freq: Four times a day (QID) | ORAL | Status: DC | PRN

## 2014-07-05 MED ORDER — METRONIDAZOLE IN NACL 5-0.79 MG/ML-% IV SOLN: 500.0000 mg | Freq: Four times a day (QID) | INTRAVENOUS | Status: DC

## 2014-07-05 MED ORDER — SODIUM CHLORIDE 0.9 % IV SOLN: INTRAVENOUS | Status: DC

## 2014-07-05 MED ORDER — SODIUM CHLORIDE 0.9 % IV SOLN
INTRAVENOUS | Status: DC
  Administered 2014-07-05 – 2014-07-07 (×6): via INTRAVENOUS

## 2014-07-05 MED ORDER — PRENATAL MULTIVITAMIN CH: 1.0000 | ORAL_TABLET | Freq: Every day | ORAL | Status: DC

## 2014-07-05 MED ORDER — ACETAMINOPHEN 325 MG PO TABS
650.0000 mg | ORAL_TABLET | ORAL | Status: DC | PRN
  Administered 2014-07-07: 650 mg via ORAL
  Filled 2014-07-05: qty 2

## 2014-07-05 MED ORDER — ONDANSETRON HCL 4 MG/2ML IJ SOLN: 4.0000 mg | Freq: Four times a day (QID) | INTRAMUSCULAR | Status: DC | PRN

## 2014-07-05 MED ORDER — HYDROCODONE-ACETAMINOPHEN 5-325 MG PO TABS
1.0000 | ORAL_TABLET | ORAL | Status: DC | PRN
  Administered 2014-07-06 (×3): 1 via ORAL
  Administered 2014-07-07: 2 via ORAL
  Administered 2014-07-07 (×2): 1 via ORAL
  Administered 2014-07-08 – 2014-07-09 (×7): 2 via ORAL
  Filled 2014-07-05: qty 1
  Filled 2014-07-05 (×4): qty 2
  Filled 2014-07-05 (×2): qty 1
  Filled 2014-07-05 (×5): qty 2
  Filled 2014-07-05: qty 1

## 2014-07-05 MED ORDER — HYDROMORPHONE HCL 1 MG/ML IJ SOLN
1.0000 mg | Freq: Once | INTRAMUSCULAR | Status: AC
  Administered 2014-07-05: 1 mg via INTRAVENOUS
  Filled 2014-07-05: qty 1

## 2014-07-05 MED ORDER — METRONIDAZOLE IN NACL 5-0.79 MG/ML-% IV SOLN
500.0000 mg | Freq: Three times a day (TID) | INTRAVENOUS | Status: DC
  Filled 2014-07-05 (×3): qty 100

## 2014-07-05 NOTE — MAU Provider Note (Signed)
History     CSN: [id number]  Arrival date and time: 07/05/14 2101   First Provider Initiated Contact with Patient 07/05/14 2144      Chief Complaint  Patient presents with  . Abdominal Pain  . Fever   HPI [name] is a 43 y.o. G5P5 who presents to MAU today with complaint of RLQ pain since noon today. She also endorses fever and nausea without vomiting. She states a history of pyosalpinx in 09/2013 that required drainage with IR. She states pain is severe. She denies vaginal bleeding or discharge.   OB History   Grav Para Term Preterm Abortions TAB SAB Ect Mult Living   5 5        5       Past Medical History  Diagnosis Date  . Anemia   . PID (pelvic inflammatory disease)   . Pregnancy induced hypertension   . Gestational diabetes     Past Surgical History  Procedure Laterality Date  . No past surgeries      Family History  Problem Relation Age of Onset  . Other Neg Hx   . Diabetes Father   . Hypertension Father     History  Substance Use Topics  . Smoking status: Never Smoker   . Smokeless tobacco: Never Used  . Alcohol Use: No    Allergies:  Allergies  Allergen Reactions  . Zosyn [Piperacillin Sod-Tazobactam So] Itching    Pt reports itching when receiving zosyn    Prescriptions prior to admission  Medication Sig Dispense Refill  . oxyCODONE-acetaminophen (PERCOCET/ROXICET) 5-325 MG per tablet Take 1 tablet by mouth every 4 (four) hours as needed for severe pain.      . ferrous gluconate (FERGON) 324 MG tablet 1 tablet 2-3 times daily with orange juice  90 tablet  2    Review of Systems  Constitutional: Positive for fever and malaise/fatigue.  Gastrointestinal: Positive for nausea and abdominal pain. Negative for vomiting.  Genitourinary: Positive for dysuria. Negative for urgency and frequency.       Neg - vaginal bleeding, discharge   Physical Exam   Blood pressure 117/67, pulse 105, temperature 101.4 F (38.6 C), temperature source  Oral, resp. rate 24, height 5\' 5"  (1.651 m), weight 197 lb 6.4 oz (89.54 kg), last menstrual period 06/27/2014, SpO2 100.00%.  Physical Exam  Constitutional: She is oriented to person, place, and time. She appears well-developed and well-nourished.  Appears uncomfortable  HENT:  Head: Normocephalic.  Cardiovascular: Normal rate.   Respiratory: Effort normal.  GI: Soft. She exhibits no distension and no mass. There is tenderness (moderate tenderness to palpation of the RLQ). There is no rebound and no guarding.  Neurological: She is alert and oriented to person, place, and time.  Skin: Skin is warm and dry. No erythema.  Psychiatric: She has a normal mood and affect.   Results for orders placed during the hospital encounter of 07/05/14 (from the past 24 hour(s))  URINALYSIS, ROUTINE W REFLEX MICROSCOPIC     Status: Abnormal   Collection Time    07/05/14  9:25 PM      Result Value Ref Range   Color, Urine YELLOW  YELLOW   APPearance CLEAR  CLEAR   Specific Gravity, Urine >1.030 (*) 1.005 - 1.030   pH 6.0  5.0 - 8.0   Glucose, UA NEGATIVE  NEGATIVE mg/dL   Hgb urine dipstick MODERATE (*) NEGATIVE   Bilirubin Urine NEGATIVE  NEGATIVE   Ketones, ur NEGATIVE  NEGATIVE mg/dL   Protein, ur NEGATIVE  NEGATIVE mg/dL   Urobilinogen, UA 0.2  0.0 - 1.0 mg/dL   Nitrite NEGATIVE  NEGATIVE   Leukocytes, UA NEGATIVE  NEGATIVE  URINE MICROSCOPIC-ADD ON     Status: Abnormal   Collection Time    07/05/14  9:25 PM      Result Value Ref Range   Squamous Epithelial / LPF FEW (*) RARE   WBC, UA 0-2  <3 WBC/hpf   RBC / HPF 3-6  <3 RBC/hpf   Bacteria, UA RARE  RARE  POCT PREGNANCY, URINE     Status: None   Collection Time    07/05/14  9:27 PM      Result Value Ref Range   Preg Test, Ur NEGATIVE  NEGATIVE  CBC WITH DIFFERENTIAL     Status: Abnormal   Collection Time    07/05/14  9:35 PM      Result Value Ref Range   WBC 6.2  4.0 - 10.5 K/uL   RBC 4.11  3.87 - 5.11 MIL/uL   Hemoglobin 11.5 (*)  12.0 - 15.0 g/dL   HCT 16.1 (*) 09.6 - 04.5 %   MCV 84.4  78.0 - 100.0 fL   MCH 28.0  26.0 - 34.0 pg   MCHC 33.1  30.0 - 36.0 g/dL   RDW 40.9  81.1 - 91.4 %   Platelets 207  150 - 400 K/uL   Neutrophils Relative % 91 (*) 43 - 77 %   Neutro Abs 5.6  1.7 - 7.7 K/uL   Lymphocytes Relative 9 (*) 12 - 46 %   Lymphs Abs 0.6 (*) 0.7 - 4.0 K/uL   Monocytes Relative 0 (*) 3 - 12 %   Monocytes Absolute 0.0 (*) 0.1 - 1.0 K/uL   Eosinophils Relative 0  0 - 5 %   Eosinophils Absolute 0.0  0.0 - 0.7 K/uL   Basophils Relative 0  0 - 1 %   Basophils Absolute 0.0  0.0 - 0.1 K/uL  COMPREHENSIVE METABOLIC PANEL     Status: Abnormal   Collection Time    07/05/14  9:35 PM      Result Value Ref Range   Sodium 135 (*) 137 - 147 mEq/L   Potassium 3.8  3.7 - 5.3 mEq/L   Chloride 100  96 - 112 mEq/L   CO2 21  19 - 32 mEq/L   Glucose, Bld 166 (*) 70 - 99 mg/dL   BUN 12  6 - 23 mg/dL   Creatinine, Ser 7.82  0.50 - 1.10 mg/dL   Calcium 8.8  8.4 - 95.6 mg/dL   Total Protein 7.3  6.0 - 8.3 g/dL   Albumin 3.7  3.5 - 5.2 g/dL   AST 52 (*) 0 - 37 U/L   ALT 34  0 - 35 U/L   Alkaline Phosphatase 109  39 - 117 U/L   Total Bilirubin 0.8  0.3 - 1.2 mg/dL   GFR calc non Af Amer >90  >90 mL/min   GFR calc Af Amer >90  >90 mL/min   Anion gap 14  5 - 15   US Transvaginal Non-ob  07/05/2014   CLINICAL DATA:  Right lower quadrant pain and fever since 12 mm today. History of pelvic inflammatory disease and right pyosalpinx.  EXAM: TRANSABDOMINAL AND TRANSVAGINAL ULTRASOUND OF PELVIS  TECHNIQUE: Both transabdominal and transvaginal ultrasound examinations of the pelvis were performed. Transabdominal technique was performed for global imaging of the pelvis including  uterus, ovaries, adnexal regions, and pelvic cul-de-sac. It was necessary to proceed with endovaginal exam following the transabdominal exam to visualize the endometrium and adnexa.  COMPARISON:  CT 10/16/2013.  Ultrasound 10/04/2013  FINDINGS: Uterus   Measurements: 12.4 x 5.9 x 6.7 cm. No fibroids or other mass visualized.  Endometrium  Thickness: 13 mm.  No focal abnormality visualized.  Right ovary  Measurements: Right ovary not definitively seen. Complex tubular structure with internal echoes/ debris noted. This area measures approximately 8.1 x 5.2 x 3.9 cm and most likely reflects complex right hydrosalpinx or pyosalpinx.  Left ovary  Measurements: 3.5 x 1.6 x 2.7 cm. Normal appearance/no adnexal mass.  Other findings  No free fluid.  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   Electronically Signed   By: Charlett NoseKevin  Dover M.D.   On: 07/05/2014 23:38   [place] Pelvis Complete  07/05/2014   CLINICAL DATA:  Right lower quadrant pain and fever since 12 mm today. History of pelvic inflammatory disease and right pyosalpinx.  EXAM: TRANSABDOMINAL AND TRANSVAGINAL ULTRASOUND OF PELVIS  TECHNIQUE: Both transabdominal and transvaginal ultrasound examinations of the pelvis were performed. Transabdominal technique was performed for global imaging of the pelvis including uterus, ovaries, adnexal regions, and pelvic cul-de-sac. It was necessary to proceed with endovaginal exam following the transabdominal exam to visualize the endometrium and adnexa.  COMPARISON:  CT 10/16/2013.  Ultrasound 10/04/2013  FINDINGS: Uterus  Measurements: 12.4 x 5.9 x 6.7 cm. No fibroids or other mass visualized.  Endometrium  Thickness: 13 mm.  No focal abnormality visualized.  Right ovary  Measurements: Right ovary not definitively seen. Complex tubular structure with internal echoes/ debris noted. This area measures approximately 8.1 x 5.2 x 3.9 cm and most likely reflects complex right hydrosalpinx or pyosalpinx.  Left ovary  Measurements: 3.5 x 1.6 x 2.7 cm. Normal appearance/no adnexal mass.  Other findings  No free fluid.  IMPRESSION: Complex tubular cystic structure in the right adnexa measuring up  to 8.1 cm, most likely complex hydrosalpinx/ pyosalpinx.   Electronically Signed   By: Charlett NoseKevin  Dover M.D.   On: 07/05/2014 23:38    MAU Course  Procedures None  MDM UPT - negative CBC, CMP, UA today Discussed patient presentation with Dr. Emelda FearFerguson. Agrees US is appropriate imaging modality at this time given patient's history Discussed lab and US results with Dr. Emelda FearFerguson. Admit to Women's Unit for IV antibiotics. Gentamycin per pharmacy protocol and Flagyl q 8 hours recommended.  Assessment and Plan  A: Right pyosalpinx  P: Admit to Women's Unit for IV antibiotics and further management  Marny LowensteinJulie N Tighe Gitto, PA-C  07/05/2014, 11:18 PM

## 2014-07-05 NOTE — MAU Note (Signed)
Pain in RLQ since 12n. Fever also. Denies any bleeding or vag d/c. No n/v/d

## 2014-07-06 ENCOUNTER — Encounter (HOSPITAL_COMMUNITY): Payer: Self-pay | Admitting: *Deleted

## 2014-07-06 LAB — RPR

## 2014-07-06 LAB — HIV ANTIBODY (ROUTINE TESTING W REFLEX): HIV: NONREACTIVE

## 2014-07-06 LAB — SEDIMENTATION RATE: Sed Rate: 50 mm/hr — ABNORMAL HIGH (ref 0–22)

## 2014-07-06 MED ORDER — GENTAMICIN SULFATE 40 MG/ML IJ SOLN
Freq: Once | INTRAMUSCULAR | Status: AC
  Administered 2014-07-06: 01:00:00 via INTRAVENOUS
  Filled 2014-07-06: qty 3.25

## 2014-07-06 MED ORDER — GENTAMICIN SULFATE 40 MG/ML IJ SOLN
Freq: Three times a day (TID) | INTRAVENOUS | Status: DC
  Administered 2014-07-06 – 2014-07-07 (×3): via INTRAVENOUS
  Filled 2014-07-06 (×5): qty 3

## 2014-07-06 NOTE — Progress Notes (Signed)
ANTIBIOTIC CONSULT NOTE - INITIAL  Pharmacy Consult for Gentamicin Indication: Pyosalpinx  Allergies  Allergen Reactions  . Zosyn [Piperacillin Sod-Tazobactam So] Itching    Pt reports itching when receiving zosyn    Patient Measurements: Height: 5\' 5"  (165.1 cm) Weight: 197 lb 6.4 oz (89.54 kg) IBW/kg (Calculated) : 57 Adjusted Body Weight: 66.8 kg  Vital Signs: Temp: 98.6 F (37 C) (10/24 0051) Temp Source: Oral (10/24 0051) BP: 122/75 mmHg (10/24 0051) Pulse Rate: 102 (10/24 0051) Intake/Output from previous day:   Intake/Output from this shift:    Labs:  Recent Labs  07/05/14 2135  WBC 6.2  HGB 11.5*  PLT 207  CREATININE 0.64   Estimated Creatinine Clearance: 98.1 ml/min (by C-G formula based on Cr of 0.64). No results found for this basename: VANCOTROUGH, VANCOPEAK, VANCORANDOM, GENTTROUGH, GENTPEAK, GENTRANDOM, TOBRATROUGH, TOBRAPEAK, TOBRARND, AMIKACINPEAK, AMIKACINTROU, AMIKACIN,  in the last 72 hours   Microbiology: No results found for this or any previous visit (from the past 720 hour(s)).  Medical History: Past Medical History  Diagnosis Date  . Anemia   . PID (pelvic inflammatory disease)   . Pregnancy induced hypertension   . Gestational diabetes     Medications:  Clindamycin  900 mg IV every 8 hours  Assessment: 43 yo non-pregnant G5P5 presented with RLQ pain, fever and nausea x 1 day. Pt has history of PID and pyosalpinx in January 2015 treated with IR drainage.  Goal of Therapy:  Gentamicin peaks 6-8 mcg/ml; troughs <1 mcg/ml  Plan:  Gentamicin 130 mg IV loading dose: then 120 mg IV every 8 hours. Monitor serum creatinine per protocol Gentamicin levels as indicated  Arelia SneddonMason, Barbera Perritt Anne 07/06/2014,2:53 AM

## 2014-07-06 NOTE — Progress Notes (Signed)
Subjective: Patient reports tolerating PO, + flatus and no problems voiding.  She has mild pain rllq.   Objective: I have reviewed patient's vital signs, labs, microbiology and radiology results.  General: alert and no distress Resp: clear to auscultation bilaterally Cardio: regular rate and rhythm GI: soft, non-tender; bowel sounds normal; no masses,  no organomegaly Extremities: extremities normal, atraumatic, no cyanosis or edema and Homans sign is negative, no sign of DVT Vaginal Bleeding: none Lmp was last week   Assessment/Plan: Righrt pyosalpinx, for acute  tx and switch to outpt care when afebrile and improving. Anticipating a brief hosp care on iv gentamycin   Will check HIV/ rpr/ GC Will check GC Chl     LOS: 1 day    Kristin [name] V 07/06/2014, 7:35 AM

## 2014-07-06 NOTE — MAU Provider Note (Signed)
Attestation of Attending Supervision of Advanced Practitioner: Evaluation and management procedures were performed by the PA/NP/CNM/OB Fellow under my supervision/collaboration. Chart reviewed and agree with management and plan.  [name] V 07/06/2014 7:26 AM

## 2014-07-06 NOTE — H&P (Signed)
History    This 43 yr female was admitted this at approximately midnight after presenting to MAU with rlq pain and fever to 101 with slight nausea but no vomiting due to suspected flare up of right pyosalpinx. 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  Abdominal Pain   .  Fever    HPI  [name] is a 43 y.o. G5P5 who presents to MAU today with complaint of RLQ pain since noon today. She also endorses fever and nausea without vomiting. She states a history of pyosalpinx in 09/2013 that required drainage with IR. She states pain is severe. She denies vaginal bleeding or discharge.  OB History    Grav  Para  Term  Preterm  Abortions  TAB  SAB  Ect  Mult  Living    5  5         5       Past Medical History   Diagnosis  Date   .  Anemia    .  PID (pelvic inflammatory disease)    .  Pregnancy induced hypertension    .  Gestational diabetes     Past Surgical History   Procedure  Laterality  Date   .  No past surgeries      Family History   Problem  Relation  Age of Onset   .  Other  Neg Hx    .  Diabetes  Father    .  Hypertension  Father     History   Substance Use Topics   .  Smoking status:  Never Smoker   .  Smokeless tobacco:  Never Used   .  Alcohol Use:  No    Allergies:  Allergies   Allergen  Reactions   .  Zosyn [Piperacillin Sod-Tazobactam So]  Itching     Pt reports itching when receiving zosyn    Prescriptions prior to admission   Medication  Sig  Dispense  Refill   .  oxyCODONE-acetaminophen (PERCOCET/ROXICET) 5-325 MG per tablet  Take  1 tablet by mouth every 4 (four) hours as needed for severe pain.     .  ferrous gluconate (FERGON) 324 MG tablet  1 tablet 2-3 times daily with orange juice  90 tablet  2    Review of Systems  Constitutional: Positive for fever and malaise/fatigue.  Gastrointestinal: Positive for nausea and abdominal pain. Negative for vomiting.  Genitourinary: Positive for dysuria. Negative for urgency and frequency.  Neg - vaginal bleeding, discharge   Physical Exam   Blood pressure 117/67, pulse 105, temperature 101.4 F (38.6 C), temperature source Oral, resp. rate 24, height 5\' 5"  (1.651 m), weight 197 lb 6.4 oz (89.54 kg), last menstrual period 06/27/2014, SpO2 100.00%.  Physical Exam  Constitutional: She is oriented to person, place, and time. She appears well-developed and well-nourished.  Appears uncomfortable  HENT:  Head: Normocephalic.  Cardiovascular: Normal rate.  Respiratory: Effort normal.  GI: Soft. She exhibits no distension and no mass. There is tenderness (moderate tenderness to palpation of  the RLQ). There is no rebound and no guarding.  Neurological: She is alert and oriented to person, place, and time.  Skin: Skin is warm and dry. No erythema.  Psychiatric: She has a normal mood and affect.   Results for orders placed during the hospital encounter of 07/05/14 (from the past 24 hour(s))   URINALYSIS, ROUTINE W REFLEX MICROSCOPIC Status: Abnormal    Collection Time    07/05/14 9:25 PM   Result  Value  Ref Range    Color, Urine  YELLOW  YELLOW    APPearance  CLEAR  CLEAR    Specific Gravity, Urine  >1.030 (*)  1.005 - 1.030    pH  6.0  5.0 - 8.0    Glucose, UA  NEGATIVE  NEGATIVE mg/dL    Hgb urine dipstick  MODERATE (*)  NEGATIVE    Bilirubin Urine  NEGATIVE  NEGATIVE    Ketones, ur  NEGATIVE  NEGATIVE mg/dL    Protein, ur  NEGATIVE  NEGATIVE mg/dL    Urobilinogen, UA  0.2  0.0 - 1.0 mg/dL    Nitrite  NEGATIVE  NEGATIVE    Leukocytes, UA  NEGATIVE  NEGATIVE   URINE  MICROSCOPIC-ADD ON Status: Abnormal    Collection Time    07/05/14 9:25 PM   Result  Value  Ref Range    Squamous Epithelial / LPF  FEW (*)  RARE    WBC, UA  0-2  <3 WBC/hpf    RBC / HPF  3-6  <3 RBC/hpf    Bacteria, UA  RARE  RARE   POCT PREGNANCY, URINE Status: None    Collection Time    07/05/14 9:27 PM   Result  Value  Ref Range    Preg Test, Ur  NEGATIVE  NEGATIVE   CBC WITH DIFFERENTIAL Status: Abnormal    Collection Time    07/05/14 9:35 PM   Result  Value  Ref Range    WBC  6.2  4.0 - 10.5 K/uL    RBC  4.11  3.87 - 5.11 MIL/uL    Hemoglobin  11.5 (*)  12.0 - 15.0 g/dL    HCT  16.134.7 (*)  09.636.0 - 46.0 %    MCV  84.4  78.0 - 100.0 fL    MCH  28.0  26.0 - 34.0 pg    MCHC  33.1  30.0 - 36.0 g/dL    RDW  04.514.4  40.911.5 - 81.115.5 %    Platelets  207  150 - 400 K/uL    Neutrophils Relative %  91 (*)  43 - 77 %    Neutro Abs  5.6  1.7 - 7.7 K/uL    Lymphocytes Relative  9 (*)  12 - 46 %    Lymphs Abs  0.6 (*)  0.7 - 4.0 K/uL    Monocytes Relative  0 (*)  3 - 12 %    Monocytes Absolute  0.0 (*)  0.1 - 1.0 K/uL    Eosinophils Relative  0  0 - 5 %    Eosinophils Absolute  0.0  0.0 - 0.7 K/uL    Basophils Relative  0  0 - 1 %    Basophils Absolute  0.0  0.0 - 0.1 K/uL   COMPREHENSIVE METABOLIC PANEL Status: Abnormal    Collection Time    07/05/14 9:35 PM   Result  Value  Ref Range    Sodium  135 (*)  137 - 147 mEq/L  Potassium  3.8  3.7 - 5.3 mEq/L    Chloride  100  96 - 112 mEq/L    CO2  21  19 - 32 mEq/L    Glucose, Bld  166 (*)  70 - 99 mg/dL    BUN  12  6 - 23 mg/dL    Creatinine, Ser  1.610.64  0.50 - 1.10 mg/dL    Calcium  8.8  8.4 - 10.5 mg/dL    Total Protein  7.3  6.0 - 8.3 g/dL    Albumin  3.7  3.5 - 5.2 g/dL    AST  52 (*)  0 - 37 U/L    ALT  34  0 - 35 U/L    Alkaline Phosphatase  109  39 - 117 U/L    Total Bilirubin  0.8  0.3 - 1.2 mg/dL    GFR calc non Af Amer  >90  >90 mL/min    GFR calc Af Amer  >90  >90 mL/min    Anion gap  14  5 - 15    [place] Transvaginal  Non-ob  07/05/2014 CLINICAL DATA: Right lower quadrant pain and fever since 12 mm today. History of pelvic inflammatory disease and right pyosalpinx. EXAM: TRANSABDOMINAL AND TRANSVAGINAL ULTRASOUND OF PELVIS TECHNIQUE: Both transabdominal and transvaginal ultrasound examinations of the pelvis were performed. Transabdominal technique was performed for global imaging of the pelvis including uterus, ovaries, adnexal regions, and pelvic cul-de-sac. It was necessary to proceed with endovaginal exam following the transabdominal exam to visualize the endometrium and adnexa. COMPARISON: CT 10/16/2013. Ultrasound 10/04/2013 FINDINGS: Uterus Measurements: 12.4 x 5.9 x 6.7 cm. No fibroids or other mass visualized. Endometrium Thickness: 13 mm. No focal abnormality visualized. Right ovary Measurements: Right ovary not definitively seen. Complex tubular structure with internal echoes/ debris noted. This area measures approximately 8.1 x 5.2 x 3.9 cm and most likely reflects complex right hydrosalpinx or pyosalpinx. Left ovary Measurements: 3.5 x 1.6 x 2.7 cm. Normal appearance/no adnexal mass. Other findings No free fluid. 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 Electronically Signed By: Charlett NoseKevin Dover M.D. On: 07/05/2014 23:38  [place] Pelvis Complete  07/05/2014 CLINICAL DATA: Right lower quadrant pain and fever since 12 mm today. History of pelvic inflammatory disease and right pyosalpinx. EXAM: TRANSABDOMINAL AND TRANSVAGINAL ULTRASOUND OF PELVIS TECHNIQUE: Both transabdominal and transvaginal ultrasound examinations of the pelvis were performed. Transabdominal technique was performed for global imaging of the pelvis including uterus, ovaries, adnexal regions, and pelvic cul-de-sac. It was necessary to proceed with endovaginal exam following the transabdominal exam to visualize the endometrium and adnexa. COMPARISON: CT 10/16/2013.  Ultrasound 10/04/2013 FINDINGS: Uterus Measurements: 12.4 x 5.9 x 6.7 cm. No fibroids or other mass visualized. Endometrium Thickness: 13 mm. No focal abnormality visualized. Right ovary Measurements: Right ovary not definitively seen. Complex tubular structure with internal echoes/ debris noted. This area measures approximately 8.1 x 5.2 x 3.9 cm and most likely reflects complex right hydrosalpinx or pyosalpinx. Left ovary Measurements: 3.5 x 1.6 x 2.7 cm. Normal appearance/no adnexal mass. Other findings No free fluid. 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 Electronically Signed By: Charlett NoseKevin Dover M.D. On: 07/05/2014 23:38   MAU Course   Procedures  None  MDM  UPT - negative  CBC, CMP, UA today  Discussed patient presentation with Dr. Emelda FearFerguson. Agrees US is appropriate imaging modality  at this time given patient's history  Discussed lab and [place] results with Dr. Emelda Fear. Admit to [facility] Unit for IV antibiotics. Gentamycin per pharmacy protocol and Flagyl q 8 hours recommended.  Assessment and Plan   A:  Right pyosalpinx  P:  Admit to Women[facility] Unit for IV antibiotics and further management  Marny Lowenstein, PA-C  07/05/2014, 11:18 PM   Antibiotics are currently Gentamycin per pharmacy and Clindamycin 900 q 8 h. Pt is afebrile thiis am, and abdomen is solft with out guarding at present, so hopefully this will be a short hospital stay with f/u plans to remove the pyosalpinx at a later daye. whien recovered.

## 2014-07-07 LAB — GENTAMICIN LEVEL, TROUGH: Gentamicin Trough: <0.5 ug/mL — ABNORMAL LOW (ref 0.5–2.0)

## 2014-07-07 LAB — GENTAMICIN LEVEL, PEAK: Gentamicin Pk: 2.2 ug/mL — ABNORMAL LOW (ref 5.0–10.0)

## 2014-07-07 MED ORDER — GENTAMICIN SULFATE 40 MG/ML IJ SOLN
Freq: Three times a day (TID) | INTRAVENOUS | Status: DC
  Administered 2014-07-07 – 2014-07-09 (×5): via INTRAVENOUS
  Filled 2014-07-07 (×6): qty 4

## 2014-07-07 MED ORDER — GENTAMICIN SULFATE 40 MG/ML IJ SOLN
Freq: Three times a day (TID) | INTRAMUSCULAR | Status: DC
  Administered 2014-07-07: 11:00:00 via INTRAVENOUS
  Filled 2014-07-07 (×3): qty 3

## 2014-07-07 NOTE — Progress Notes (Signed)
Subjective: Patient reports no problems voiding.  Pain improved but still present RLQ  Objective: I have reviewed patient's vital signs, intake and output, medications and labs. Blood pressure 117/75, pulse 80, temperature 99.9 F (37.7 C), temperature source Oral, resp. rate 17, height 5\' 5"  (1.651 m), weight 197 lb 6.4 oz (89.54 kg), last menstrual period 06/27/2014, SpO2 98.00%. Tmax 102.2 General: alert, cooperative and no distress GI: mild RLQ tenderness no rebound or guarding   Assessment/Plan: R pyosalpinx, continue antibiotics   LOS: 2 days    Kristin [name] 07/07/2014, 7:20 AM

## 2014-07-07 NOTE — Progress Notes (Signed)
ANTIBIOTIC CONSULT NOTE - FOLLOW UP  Pharmacy Consult for Gentamicin Indication: Pyosalpinx  Allergies  Allergen Reactions  . Zosyn [Piperacillin Sod-Tazobactam So] Itching    Pt reports itching when receiving zosyn    Patient Measurements: Height: 5\' 5"  (165.1 cm) Weight: 197 lb 6.4 oz (89.54 kg) IBW/kg (Calculated) : 57kg Adjusted Body Weight: 67 kg  Vital Signs: Tmax 102.2 @ 02:04 this morning Temp: 99.3 F (37.4 C) (10/25 1336) Temp Source: Oral (10/25 1336) BP: 123/81 mmHg (10/25 1336) Pulse Rate: 86 (10/25 1336) Intake/Output from previous day: 10/24 0701 - 10/25 0700 In: 4526.3 [P.O.:320; I.V.:4006.3; IV Piggyback:200] Out: 3400 [Urine:3400] Intake/Output from this shift: Total I/O In: 720 [P.O.:720] Out: 2500 [Urine:2500]  Labs:  Recent Labs  07/05/14 2135  WBC 6.2  HGB 11.5*  PLT 207  CREATININE 0.64   Estimated Creatinine Clearance: 98.1 ml/min (by C-G formula based on Cr of 0.64).  Recent Labs  07/07/14 0835 07/07/14 1327  GENTTROUGH <0.5*  --   GENTPEAK  --  2.2*       Anti-infectives   Start     Dose/Rate Route Frequency Ordered Stop   07/07/14 1800  gentamicin (GARAMYCIN) 160 mg, clindamycin (CLEOCIN) 900 mg in dextrose 5 % 100 mL IVPB     220 mL/hr over 30 Minutes Intravenous Every 8 hours 07/07/14 1425     07/07/14 0900  gentamicin (GARAMYCIN) 120 mg, clindamycin (CLEOCIN) 900 mg in dextrose 5 % 100 mL IVPB  Status:  Discontinued     218 mL/hr over 30 Minutes Intravenous 3 times per day 07/07/14 0819 07/07/14 1425   07/06/14 0900  gentamicin (GARAMYCIN) 120 mg, clindamycin (CLEOCIN) 900 mg in dextrose 5 % 100 mL IVPB  Status:  Discontinued     218 mL/hr over 30 Minutes Intravenous 3 times per day 07/06/14 0011 07/07/14 0813   07/06/14 0100  gentamicin (GARAMYCIN) 130 mg, clindamycin (CLEOCIN) 900 mg in dextrose 5 % 100 mL IVPB     218.5 mL/hr over 30 Minutes Intravenous  Once 07/06/14 0010 07/06/14 0130   07/06/14 0000  metroNIDAZOLE  (FLAGYL) IVPB 500 mg  Status:  Discontinued     500 mg 100 mL/hr over 60 Minutes Intravenous Every 6 hours 07/05/14 2348 07/05/14 2349   07/06/14 0000  metroNIDAZOLE (FLAGYL) IVPB 500 mg  Status:  Discontinued     500 mg 100 mL/hr over 60 Minutes Intravenous Every 8 hours 07/05/14 2349 07/06/14 0006      Assessment: Gentamicin extrapolated peak is approximately 5.5 mcg/ml with the goal being 6-8 mcg/ml.  Gentamicin trough level is <1 mcg/ml which indicates adequate clearance of the medication.  Goal of Therapy:  Gentamicin peak 6-8 mcg/ml Gentamicin trough <1 mcg/ml  Plan:  Increase Gentamicin to 160 mg IV Q 8 hr to maintain an adequate peak level. Continue Clindamycin 900 mg IV Q 8 hr. Will monitor renal function and/or levels if clinically necessary.  Kristin [name], Kristin [name] 07/07/2014,2:25 PM

## 2014-07-08 DIAGNOSIS — N7093 Salpingitis and oophoritis, unspecified: Secondary | ICD-10-CM

## 2014-07-08 DIAGNOSIS — N7011 Chronic salpingitis: Secondary | ICD-10-CM

## 2014-07-08 LAB — GC/CHLAMYDIA PROBE AMP
CT PROBE, AMP APTIMA: NEGATIVE
GC PROBE AMP APTIMA: NEGATIVE

## 2014-07-08 MED ORDER — MAGNESIUM HYDROXIDE 400 MG/5ML PO SUSP
15.0000 mL | Freq: Every day | ORAL | Status: DC | PRN
  Administered 2014-07-08: 30 mL via ORAL
  Filled 2014-07-08: qty 30

## 2014-07-08 NOTE — Progress Notes (Signed)
Subjective: Patient reports no problems voiding.  Pain improved but still present RLQ. Reports constipation since 07/05/14.  Objective: I have reviewed patient's vital signs, intake and output, medications and labs. Filed Vitals:   07/07/14 1803 07/07/14 2200 07/08/14 0201 07/08/14 0619  BP: 117/74 117/73 121/77 125/76  Pulse: 85 83 92 81  Temp: 99.2 F (37.3 C) 99.2 F (37.3 C) 99.3 F (37.4 C) 98.8 F (37.1 C)  TempSrc: Oral Oral Oral Oral  Resp: 16 18 20 20   Height:      Weight:      SpO2: 100% 99% 100% 100%  Last fever was 102.2 07/07/14 0200  General: alert, cooperative and no distress GI: mild RLQ tenderness no rebound or guarding   Assessment/Plan: R pyosalpinx, 24 hours afebrile for now. Continue IV antibiotics (Gentamicin and Clindamycin) and analgesia; transition to oral antibiotics tomorrow if remains stable If fever recurs, consider IR drainage Milk of magnesia ordered for constipation, will monitor response Continue routine care   LOS: 3 days   Trinka Keshishyan A, MD 07/08/2014, 6:21 AM

## 2014-07-08 NOTE — Plan of Care (Signed)
Problem: Phase III Progression Outcomes Goal: Pain controlled on oral analgesia Outcome: Completed/Met Date Met:  07/08/14 Good pain control when given 2 Vicodin.One Vicodin did not last long enough. Goal: Activity at appropriate level-compared to baseline (UP IN CHAIR FOR HEMODIALYSIS)  Outcome: Completed/Met Date Met:  07/08/14 Tolerates walking to bathroom well. Goal: Voiding independently Outcome: Completed/Met Date Met:  07/08/14 Voids large amounts of amber urine.

## 2014-07-08 NOTE — Progress Notes (Signed)
Ur chart review completed.  

## 2014-07-09 ENCOUNTER — Inpatient Hospital Stay (HOSPITAL_COMMUNITY)
Admission: AD | Admit: 2014-07-09 | Discharge: 2014-07-18 | DRG: 742 | Disposition: A | Discharge status: Home / Self Care | Payer: 59 | Source: Ambulatory Visit | Attending: Family Medicine | Admitting: Family Medicine

## 2014-07-09 ENCOUNTER — Inpatient Hospital Stay (HOSPITAL_COMMUNITY): Payer: 59

## 2014-07-09 ENCOUNTER — Encounter (HOSPITAL_COMMUNITY): Payer: Self-pay | Admitting: *Deleted

## 2014-07-09 DIAGNOSIS — Z880 Allergy status to penicillin: Secondary | ICD-10-CM

## 2014-07-09 DIAGNOSIS — N7093 Salpingitis and oophoritis, unspecified: Secondary | ICD-10-CM | POA: Diagnosis not present

## 2014-07-09 DIAGNOSIS — N739 Female pelvic inflammatory disease, unspecified: Secondary | ICD-10-CM | POA: Diagnosis present

## 2014-07-09 DIAGNOSIS — B9620 Unspecified Escherichia coli [E. coli] as the cause of diseases classified elsewhere: Secondary | ICD-10-CM | POA: Diagnosis present

## 2014-07-09 DIAGNOSIS — R630 Anorexia: Secondary | ICD-10-CM | POA: Diagnosis present

## 2014-07-09 DIAGNOSIS — K219 Gastro-esophageal reflux disease without esophagitis: Secondary | ICD-10-CM | POA: Diagnosis not present

## 2014-07-09 DIAGNOSIS — N7013 Chronic salpingitis and oophoritis: Secondary | ICD-10-CM

## 2014-07-09 DIAGNOSIS — R140 Abdominal distension (gaseous): Secondary | ICD-10-CM

## 2014-07-09 DIAGNOSIS — Z6831 Body mass index (BMI) 31.0-31.9, adult: Secondary | ICD-10-CM

## 2014-07-09 DIAGNOSIS — K567 Ileus, unspecified: Secondary | ICD-10-CM | POA: Diagnosis present

## 2014-07-09 DIAGNOSIS — D649 Anemia, unspecified: Secondary | ICD-10-CM | POA: Diagnosis present

## 2014-07-09 DIAGNOSIS — R1110 Vomiting, unspecified: Secondary | ICD-10-CM

## 2014-07-09 LAB — CBC WITH DIFFERENTIAL/PLATELET
Basophils Absolute: 0 10*3/uL (ref 0.0–0.1)
Basophils Relative: 0 % (ref 0–1)
Eosinophils Absolute: 0 10*3/uL (ref 0.0–0.7)
Eosinophils Relative: 0 % (ref 0–5)
HEMATOCRIT: 34.4 % — AB (ref 36.0–46.0)
Hemoglobin: 11.8 g/dL — ABNORMAL LOW (ref 12.0–15.0)
LYMPHS PCT: 10 % — AB (ref 12–46)
Lymphs Abs: 0.7 10*3/uL (ref 0.7–4.0)
MCH: 27.9 pg (ref 26.0–34.0)
MCHC: 34.3 g/dL (ref 30.0–36.0)
MCV: 81.3 fL (ref 78.0–100.0)
MONO ABS: 0.5 10*3/uL (ref 0.1–1.0)
Monocytes Relative: 7 % (ref 3–12)
Neutro Abs: 5.8 10*3/uL (ref 1.7–7.7)
Neutrophils Relative %: 83 % — ABNORMAL HIGH (ref 43–77)
PLATELETS: 264 10*3/uL (ref 150–400)
RBC: 4.23 MIL/uL (ref 3.87–5.11)
RDW: 14.6 % (ref 11.5–15.5)
WBC: 7 10*3/uL (ref 4.0–10.5)

## 2014-07-09 MED ORDER — HYDROMORPHONE HCL 1 MG/ML IJ SOLN
1.0000 mg | Freq: Once | INTRAMUSCULAR | Status: AC
  Administered 2014-07-09: 1 mg via INTRAMUSCULAR
  Filled 2014-07-09: qty 1

## 2014-07-09 MED ORDER — HYDROCODONE-ACETAMINOPHEN 5-325 MG PO TABS: 1.0000 | ORAL_TABLET | ORAL | Status: DC | PRN

## 2014-07-09 MED ORDER — HYDROMORPHONE HCL 1 MG/ML IJ SOLN: 1.0000 mg | Freq: Once | INTRAMUSCULAR | Status: DC

## 2014-07-09 MED ORDER — DOXYCYCLINE HYCLATE 100 MG PO CAPS: 100.0000 mg | ORAL_CAPSULE | Freq: Two times a day (BID) | ORAL | Status: DC

## 2014-07-09 MED ORDER — METRONIDAZOLE 500 MG PO TABS: 500.0000 mg | ORAL_TABLET | Freq: Two times a day (BID) | ORAL | Status: DC

## 2014-07-09 MED ORDER — IBUPROFEN 600 MG PO TABS: 600.0000 mg | ORAL_TABLET | Freq: Four times a day (QID) | ORAL | Status: DC | PRN

## 2014-07-09 MED ORDER — DOCUSATE SODIUM 100 MG PO CAPS: 100.0000 mg | ORAL_CAPSULE | Freq: Two times a day (BID) | ORAL | Status: AC | PRN

## 2014-07-09 NOTE — Discharge Instructions (Signed)
Abscess °An abscess is an infected area that contains a collection of pus and debris. It can occur in almost any part of the body. An abscess is also known as a furuncle or boil. °CAUSES  °An abscess occurs when tissue gets infected. This can occur from blockage of oil or sweat glands, infection of hair follicles, or a minor injury to the skin. As the body tries to fight the infection, pus collects in the area and creates pressure under the skin. This pressure causes pain. People with weakened immune systems have difficulty fighting infections and get certain abscesses more often.  °SYMPTOMS °Usually an abscess develops on the skin and becomes a painful mass that is red, warm, and tender. If the abscess forms under the skin, you may feel a moveable soft area under the skin. Some abscesses break open (rupture) on their own, but most will continue to get worse without care. The infection can spread deeper into the body and eventually into the bloodstream, causing you to feel ill.  °DIAGNOSIS  °Your caregiver will take your medical history and perform a physical exam. A sample of fluid may also be taken from the abscess to determine what is causing your infection. °TREATMENT  °Your caregiver may prescribe antibiotic medicines to fight the infection. However, taking antibiotics alone usually does not cure an abscess. Your caregiver may need to make a small cut (incision) in the abscess to drain the pus. In some cases, gauze is packed into the abscess to reduce pain and to continue draining the area. °HOME CARE INSTRUCTIONS  °· Only take over-the-counter or prescription medicines for pain, discomfort, or fever as directed by your caregiver. °· If you were prescribed antibiotics, take them as directed. Finish them even if you start to feel better. °· If gauze is used, follow your caregiver's directions for changing the gauze. °· To avoid spreading the infection: °¨ Keep your draining abscess covered with a  bandage. °¨ Wash your hands well. °¨ Do not share personal care items, towels, or whirlpools with others. °¨ Avoid skin contact with others. °· Keep your skin and clothes clean around the abscess. °· Keep all follow-up appointments as directed by your caregiver. °SEEK MEDICAL CARE IF:  °· You have increased pain, swelling, redness, fluid drainage, or bleeding. °· You have muscle aches, chills, or a general ill feeling. °· You have a fever. °MAKE SURE YOU:  °· Understand these instructions. °· Will watch your condition. °· Will get help right away if you are not doing well or get worse. °Document Released: 06/09/2005 Document Revised: 02/29/2012 Document Reviewed: 11/12/2011 °ExitCare® Patient Information ©2015 [employer]. This information is not intended to replace advice given to you by your health care provider. Make sure you discuss any questions you have with your health care provider. °Pelvic Inflammatory Disease °Pelvic inflammatory disease (PID) refers to an infection in some or all of the female organs. The infection can be in the uterus, ovaries, fallopian tubes, or the surrounding tissues in the pelvis. PID can cause abdominal or pelvic pain that comes on suddenly (acute pelvic pain). PID is a serious infection because it can lead to lasting (chronic) pelvic pain or the inability to have children (infertile).  °CAUSES  °The infection is often caused by the normal bacteria found in the vaginal tissues. PID may also be caused by an infection that is spread during sexual contact. PID can also occur following:  °· The birth of a baby.   °· A miscarriage.   °·   An abortion.   °· Major pelvic surgery.   °· The use of an intrauterine device (IUD).   °· A sexual assault.   °RISK FACTORS °Certain factors can put a person at higher risk for PID, such as: °· Being younger than 25 years. °· Being sexually active at a young age. °· Using nonbarrier contraception. °· Having multiple sexual partners. °· Having sex with  someone who has symptoms of a genital infection. °· Using oral contraception. °Other times, certain behaviors can increase the possibility of getting PID, such as: °· Having sex during your period. °· Using a vaginal douche. °· Having an intrauterine device (IUD) in place. °SYMPTOMS  °· Abdominal or pelvic pain.   °· Fever.   °· Chills.   °· Abnormal vaginal discharge. °· Abnormal uterine bleeding.   °· Unusual pain shortly after finishing your period. °DIAGNOSIS  °Your caregiver will choose some of the following methods to make a diagnosis, such as:  °· Performing a physical exam and history. A pelvic exam typically reveals a very tender uterus and surrounding pelvis.   °· Ordering laboratory tests including a pregnancy test, blood tests, and urine test.  °· Ordering cultures of the vagina and cervix to check for a sexually transmitted infection (STI). °· Performing an ultrasound.   °· Performing a laparoscopic procedure to look inside the pelvis.   °TREATMENT  °· Antibiotic medicines may be prescribed and taken by mouth.   °· Sexual partners may be treated when the infection is caused by a sexually transmitted disease (STD).   °· Hospitalization may be needed to give antibiotics intravenously. °· Surgery may be needed, but this is rare. °It may take weeks until you are completely well. If you are diagnosed with PID, you should also be checked for human immunodeficiency virus (HIV).   °HOME CARE INSTRUCTIONS  °· If given, take your antibiotics as directed. Finish the medicine even if you start to feel better.   °· Only take over-the-counter or prescription medicines for pain, discomfort, or fever as directed by your caregiver.   °· Do not have sexual intercourse until treatment is completed or as directed by your caregiver. If PID is confirmed, your recent sexual partner(s) will need treatment.   °· Keep your follow-up appointments. °SEEK MEDICAL CARE IF:  °· You have increased or abnormal vaginal discharge.    °· You need prescription medicine for your pain.   °· You vomit.   °· You cannot take your medicines.   °· Your partner has an STD.   °SEEK IMMEDIATE MEDICAL CARE IF:  °· You have a fever.   °· You have increased abdominal or pelvic pain.   °· You have chills.   °· You have pain when you urinate.   °· You are not better after 72 hours following treatment.   °MAKE SURE YOU:  °· Understand these instructions. °· Will watch your condition. °· Will get help right away if you are not doing well or get worse. °Document Released: 08/30/2005 Document Revised: 12/25/2012 Document Reviewed: 08/26/2011 °ExitCare® Patient Information ©2015 [employer]. This information is not intended to replace advice given to you by your health care provider. Make sure you discuss any questions you have with your health care provider. ° °

## 2014-07-09 NOTE — Discharge Summary (Signed)
Physician Discharge Summary  Patient ID: Kristin [name] MRN: [id number] DOB/AGE: 45/09/1970 45 y.o.  Admit date: 07/05/2014 Discharge date: 07/09/2014  Admission Diagnoses: chronic right TOA  Discharge Diagnoses: same Principal Problem:   Pyosalpinx   Discharged Condition: good  Hospital Course: the patient presented to the ED with worsening abdominal pain and fevers at home. Ultrasound obtained on admission revealed the presence of a 9 cm right TOA which appeared to be chronic. Patient was admitted to receive IV antibiotics and pain management. After being afebrile for 48 hours, patient was stable for discharge and to complete a 14 day course of po antibiotics. Discharge instructions were provided. [facility] of her [facility]A  Consults: None   Discharge Exam: Blood pressure 113/71, pulse 75, temperature 98.9 F (37.2 C), temperature source Oral, resp. rate 18, height 5\' 5"  (1.651 m), weight 197 lb 6.4 oz (89.54 kg), last menstrual period 06/27/2014, SpO2 100.00%. General appearance: alert, cooperative and no distress Resp: clear to auscultation bilaterally Cardio: regular rate and rhythm GI: soft, nondistended, RLQ tenderness, no rebound, no guarding Extremities: Homans sign is negative, no sign of DVT  Disposition: 01-Home or Self Care     Medication List    STOP taking these medications       oxyCODONE-acetaminophen 5-325 MG per tablet  Commonly known as:  PERCOCET/ROXICET      TAKE these medications       docusate sodium 100 MG capsule  Commonly known as:  COLACE  Take 1 capsule (100 mg total) by mouth 2 (two) times daily as needed.     doxycycline 100 MG capsule  Commonly known as:  VIBRAMYCIN  Take 1 capsule (100 mg total) by mouth 2 (two) times daily.     ferrous gluconate 324 MG tablet  Commonly known as:  FERGON  1 tablet 2-3 times daily with orange juice     HYDROcodone-acetaminophen 5-325 MG per tablet  Commonly known as:  NORCO/VICODIN  Take 1-2 tablets by mouth every 4 (four) hours as needed for moderate pain.     ibuprofen 600 MG tablet  Commonly known as:  ADVIL,MOTRIN  Take 1 tablet (600 mg total) by mouth every 6 (six) hours as needed.     metroNIDAZOLE 500 MG tablet  Commonly known as:  FLAGYL  Take 1 tablet (500 mg total) by mouth 2 (two) times daily.       Follow-up Information   [facility] (an appointment will be made for you to follow up in 2-3 weeks)    Specialty:  Obstetrics and Gynecology   Contact information:   [address] [place] [id number] [phone]      Signed: Peyton Rossner 07/09/2014, 6:26 AM

## 2014-07-09 NOTE — MAU Note (Signed)
Pt. States she does not need an interpreter. Pt. Notes [facility] and was sent home with pain management. This pain medication has not been helping and she is here due to the increased pain. Is unsure of her last dose time of her pain medication and antibiotics. Denies bleeding or any discharge. Pt. States she has had a fever since discharge as well.

## 2014-07-09 NOTE — Progress Notes (Signed)
Pt discharged to home with sister.  Condition stable.  Pt ambulated to car with V. Pittman, NT.  No equipment for home ordered at discharge. 

## 2014-07-09 NOTE — MAU Provider Note (Signed)
History     CSN: [id number]  Arrival date and time: 07/09/14 2050   First Provider Initiated Contact with Patient 07/09/14 2117      No chief complaint on file.  HPI [name] is a 43 y.o. G5P5 who presents to MAU be EMS with complaint of RLQ pain. The patient [facility] and pain management from 10/23-10/27. She was discharged this morning with PO antibiotics and Vicodin. The patient states that she took all of these medications today, but is unsure what time. She states that pain became much worse around 1830 tonight. She feels that she may have a fever, but did not take her temperature. She rates her pain at 20/10 now. She denies vaginal bleeding, discharge or N/V.   OB History   Grav Para Term Preterm Abortions TAB SAB Ect Mult Living   5 5        5       Past Medical History  Diagnosis Date  . Anemia   . PID (pelvic inflammatory disease)   . Pregnancy induced hypertension   . Gestational diabetes     Past Surgical History  Procedure Laterality Date  . No past surgeries      Family History  Problem Relation Age of Onset  . Other Neg Hx   . Diabetes Father   . Hypertension Father     History  Substance Use Topics  . Smoking status: Never Smoker   . Smokeless tobacco: Never Used  . Alcohol Use: No    Allergies:  Allergies  Allergen Reactions  . Zosyn [Piperacillin Sod-Tazobactam So] Itching    Pt reports itching when receiving zosyn    Prescriptions prior to admission  Medication Sig Dispense Refill  . docusate sodium (COLACE) 100 MG capsule Take 1 capsule (100 mg total) by mouth 2 (two) times daily as needed.  30 capsule  2  . doxycycline (VIBRAMYCIN) 100 MG capsule Take 1 capsule (100 mg total) by mouth 2 (two) times daily.  28 capsule  0  . ferrous gluconate (FERGON) 324 MG tablet 1 tablet 2-3 times daily with orange juice  90 tablet  2  . HYDROcodone-acetaminophen (NORCO/VICODIN) 5-325 MG per tablet Take 1-2 tablets by  mouth every 4 (four) hours as needed for moderate pain.  30 tablet  0  . ibuprofen (ADVIL,MOTRIN) 600 MG tablet Take 1 tablet (600 mg total) by mouth every 6 (six) hours as needed.  30 tablet  1  . metroNIDAZOLE (FLAGYL) 500 MG tablet Take 1 tablet (500 mg total) by mouth 2 (two) times daily.  28 tablet  0    Review of Systems  Constitutional: Positive for fever. Negative for malaise/fatigue.  Gastrointestinal: Positive for abdominal pain. Negative for nausea and vomiting.  Genitourinary: Negative for dysuria, urgency and frequency.       Neg - vaginal bleeding, discharge   Physical Exam   Blood pressure 98/52, pulse 73, temperature 97.6 F (36.4 C), temperature source Oral, resp. rate 16, last menstrual period 06/27/2014, SpO2 100.00%.  Physical Exam  Constitutional: She is oriented to person, place, and time. She appears well-developed and well-nourished.  Appears uncomfortable  HENT:  Head: Normocephalic.  Cardiovascular: Normal rate.   Respiratory: Effort normal.  GI: Soft. She exhibits distension. She exhibits no mass. There is tenderness (diffuse moderate tenderness to palpation of the abdomen). There is guarding (RLQ guarding). There is no rebound.  Neurological: She is alert and oriented to person, place, and time.  Skin: Skin is warm and dry. No erythema.  Psychiatric: She has a normal mood and affect.   Results for orders placed during the hospital encounter of 07/09/14 (from the past 24 hour(s))  CBC WITH DIFFERENTIAL     Status: Abnormal   Collection Time    07/09/14 10:38 PM      Result Value Ref Range   WBC 7.0  4.0 - 10.5 K/uL   RBC 4.23  3.87 - 5.11 MIL/uL   Hemoglobin 11.8 (*) 12.0 - 15.0 g/dL   HCT 82.934.4 (*) 56.236.0 - 13.046.0 %   MCV 81.3  78.0 - 100.0 fL   MCH 27.9  26.0 - 34.0 pg   MCHC 34.3  30.0 - 36.0 g/dL   RDW 86.514.6  78.411.5 - 69.615.5 %   Platelets 264  150 - 400 K/uL   Neutrophils Relative % 83 (*) 43 - 77 %   Neutro Abs 5.8  1.7 - 7.7 K/uL   Lymphocytes  Relative 10 (*) 12 - 46 %   Lymphs Abs 0.7  0.7 - 4.0 K/uL   Monocytes Relative 7  3 - 12 %   Monocytes Absolute 0.5  0.1 - 1.0 K/uL   Eosinophils Relative 0  0 - 5 %   Eosinophils Absolute 0.0  0.0 - 0.7 K/uL   Basophils Relative 0  0 - 1 %   Basophils Absolute 0.0  0.0 - 0.1 K/uL   [place] Transvaginal Non-ob  07/09/2014   CLINICAL DATA:  Tubo-ovarian abscess.  EXAM: ULTRASOUND PELVIS TRANSVAGINAL  TECHNIQUE: Transvaginal ultrasound examination of the pelvis was performed including evaluation of the uterus, ovaries, adnexal regions, and pelvic cul-de-sac.  COMPARISON:  07/05/2014  FINDINGS: Uterus  Measurements: 12 x 6 x 7 cm. No fibroids or other mass visualized.  Endometrium  Thickness: 7 mm.  No focal abnormality visualized.  Right ovary  The right ovary is not visible. A complex tubular or multi septated hypoechoic structure in the right adnexa has more hypoechoic contents, although still complicated by diffuse mid level echoes. The thick wall is hypervascular with suggestion of thickened endosalpingeal folds. The collection has enlarged from previous, now 10 x 6 x 7 cm - 8 x 5 x 4 cm previously.  Left ovary  Not visualized.  No adnexal mass.  Other findings:  No free fluid  IMPRESSION: Complex right adnexal cystic mass consistent with pyosalpinx or tubo-ovarian abscess. The collection has increased from 07/05/2014.   Electronically Signed   By: Tiburcio PeaJonathan  Watts M.D.   On: 07/09/2014 23:52    MAU Course  Procedures None  MDM 1 mg Dilaudid given in MAU. Patient reports minimal improvement in pain but appears much more comfortable and drowsy Discussed patient history and presentation today with Dr. Adrian BlackwaterStinson. CBC and US today Discussed US results with Dr. Adrian BlackwaterStinson. Admit patient to Women's Unit. Possible consult to IR in the morning.   Assessment and Plan  A: TOA, increased in size since 07/05/14  P: Admit to Women's Unit for IV antibiotics and pain management and possible IR  consult   Marny LowensteinJulie N Wenzel, PA-C  07/10/2014, 12:17 AM

## 2014-07-10 ENCOUNTER — Ambulatory Visit (HOSPITAL_COMMUNITY): Payer: 59

## 2014-07-10 ENCOUNTER — Ambulatory Visit (HOSPITAL_COMMUNITY)
Admit: 2014-07-10 | Discharge: 2014-07-10 | Disposition: A | Discharge status: Home / Self Care | Payer: 59 | Attending: Interventional Radiology | Admitting: Interventional Radiology

## 2014-07-10 ENCOUNTER — Ambulatory Visit (HOSPITAL_COMMUNITY)
Admit: 2014-07-10 | Discharge: 2014-07-10 | Disposition: A | Discharge status: Home / Self Care | Payer: 59 | Attending: Obstetrics & Gynecology | Admitting: Obstetrics & Gynecology

## 2014-07-10 ENCOUNTER — Encounter (HOSPITAL_COMMUNITY): Payer: Self-pay | Admitting: *Deleted

## 2014-07-10 VITALS — BP 119/81 | HR 96 | Temp 98.6°F | Resp 20

## 2014-07-10 DIAGNOSIS — R630 Anorexia: Secondary | ICD-10-CM | POA: Diagnosis present

## 2014-07-10 DIAGNOSIS — B9620 Unspecified Escherichia coli [E. coli] as the cause of diseases classified elsewhere: Secondary | ICD-10-CM | POA: Diagnosis present

## 2014-07-10 DIAGNOSIS — K567 Ileus, unspecified: Secondary | ICD-10-CM

## 2014-07-10 DIAGNOSIS — Z6831 Body mass index (BMI) 31.0-31.9, adult: Secondary | ICD-10-CM | POA: Diagnosis not present

## 2014-07-10 DIAGNOSIS — N7093 Salpingitis and oophoritis, unspecified: Secondary | ICD-10-CM | POA: Diagnosis present

## 2014-07-10 DIAGNOSIS — N739 Female pelvic inflammatory disease, unspecified: Secondary | ICD-10-CM | POA: Diagnosis present

## 2014-07-10 DIAGNOSIS — D649 Anemia, unspecified: Secondary | ICD-10-CM | POA: Diagnosis present

## 2014-07-10 DIAGNOSIS — Z880 Allergy status to penicillin: Secondary | ICD-10-CM | POA: Diagnosis not present

## 2014-07-10 DIAGNOSIS — K219 Gastro-esophageal reflux disease without esophagitis: Secondary | ICD-10-CM | POA: Diagnosis not present

## 2014-07-10 LAB — PROTIME-INR
INR: 1.16 (ref 0.00–1.49)
Prothrombin Time: 14.9 seconds (ref 11.6–15.2)

## 2014-07-10 MED ORDER — MIDAZOLAM HCL 2 MG/2ML IJ SOLN
INTRAMUSCULAR | Status: AC | PRN
  Administered 2014-07-10 (×5): 1 mg via INTRAVENOUS

## 2014-07-10 MED ORDER — SODIUM CHLORIDE 0.9 % IV SOLN
INTRAVENOUS | Status: DC
  Administered 2014-07-10: 11:00:00 via INTRAVENOUS
  Administered 2014-07-10: 125 mL/h via INTRAVENOUS
  Administered 2014-07-11 – 2014-07-14 (×7): via INTRAVENOUS

## 2014-07-10 MED ORDER — PRENATAL MULTIVITAMIN CH
1.0000 | ORAL_TABLET | Freq: Every day | ORAL | Status: DC
  Administered 2014-07-11: 1 via ORAL
  Filled 2014-07-10 (×2): qty 1

## 2014-07-10 MED ORDER — FENTANYL CITRATE 0.05 MG/ML IJ SOLN
INTRAMUSCULAR | Status: AC | PRN
  Administered 2014-07-10 (×3): 50 ug via INTRAVENOUS

## 2014-07-10 MED ORDER — HYDROMORPHONE HCL 1 MG/ML IJ SOLN
0.2000 mg | INTRAMUSCULAR | Status: DC | PRN
  Administered 2014-07-10: 0.2 mg via INTRAVENOUS
  Administered 2014-07-10: 0.6 mg via INTRAVENOUS
  Filled 2014-07-10 (×2): qty 1

## 2014-07-10 MED ORDER — OXYCODONE-ACETAMINOPHEN 5-325 MG PO TABS
1.0000 | ORAL_TABLET | ORAL | Status: DC | PRN
  Administered 2014-07-10 – 2014-07-17 (×17): 1 via ORAL
  Filled 2014-07-10 (×18): qty 1

## 2014-07-10 MED ORDER — ONDANSETRON HCL 4 MG PO TABS
4.0000 mg | ORAL_TABLET | Freq: Four times a day (QID) | ORAL | Status: DC | PRN
  Administered 2014-07-11: 4 mg via ORAL
  Filled 2014-07-10: qty 1

## 2014-07-10 MED ORDER — DOXYCYCLINE HYCLATE 100 MG IV SOLR
100.0000 mg | Freq: Two times a day (BID) | INTRAVENOUS | Status: DC
  Administered 2014-07-10 – 2014-07-18 (×18): 100 mg via INTRAVENOUS
  Filled 2014-07-10 (×19): qty 100

## 2014-07-10 MED ORDER — DEXTROSE 5 % IV SOLN
2.0000 g | Freq: Two times a day (BID) | INTRAVENOUS | Status: DC
  Administered 2014-07-10 – 2014-07-18 (×18): 2 g via INTRAVENOUS
  Filled 2014-07-10 (×19): qty 2

## 2014-07-10 MED ORDER — HYDROMORPHONE HCL 1 MG/ML IJ SOLN
0.2000 mg | INTRAMUSCULAR | Status: DC | PRN
  Administered 2014-07-10: 0.6 mg via INTRAVENOUS
  Filled 2014-07-10: qty 1

## 2014-07-10 MED ORDER — MIDAZOLAM HCL 2 MG/2ML IJ SOLN
INTRAMUSCULAR | Status: AC
  Filled 2014-07-10: qty 6

## 2014-07-10 MED ORDER — ONDANSETRON HCL 4 MG/2ML IJ SOLN
4.0000 mg | Freq: Four times a day (QID) | INTRAMUSCULAR | Status: DC | PRN
  Administered 2014-07-13: 4 mg via INTRAVENOUS
  Filled 2014-07-10: qty 2

## 2014-07-10 MED ORDER — FENTANYL CITRATE 0.05 MG/ML IJ SOLN
INTRAMUSCULAR | Status: AC
  Filled 2014-07-10: qty 6

## 2014-07-10 MED ORDER — IOHEXOL 300 MG/ML  SOLN
100.0000 mL | Freq: Once | INTRAMUSCULAR | Status: AC | PRN
  Administered 2014-07-10: 100 mL via INTRAVENOUS

## 2014-07-10 NOTE — Progress Notes (Signed)
Ur chart review completed.  

## 2014-07-10 NOTE — Progress Notes (Signed)
[facility] for monitoring prior to drain insertion in interventional radiology.  Pt admitted in stable condition to short stay dept.  Will monitor. Oriented to unit and staff.  Ardyth GalAnderson, Horatio Bertz Ann, RN 07/10/2014

## 2014-07-10 NOTE — Progress Notes (Signed)
Pt was sent to IR for possible drainage of abdominal  Abscess, per [facility]

## 2014-07-10 NOTE — Progress Notes (Signed)
CT and US studies reviewed and show recurrent complex, multiloculated pyosalpinx/tubo-ovarian abscess.  Will proceed with CT guided drainage as in January to drain.

## 2014-07-10 NOTE — H&P (Signed)
Faculty Practice GYN History and Physical  Kristin [name] WUJ:[id number] DOB: 03/26/1971 DOA: 07/09/2014  Chief Complaint: right abdominal pain  HPI: Kristin [name] is a 43 y.o. female G5P5 increased RLQ pain.  She was admitted to the hospital for IV antibiotics on 10/23 due to a right sided 8cm tuboovarian abscess and was discharged this morning after being afebrile for 48 hours.  After being home, she took her medications, but started having increased pain and felt feverish around 6:30pm.  She was brought by EMS to the hospital.  Pain rated 10/10.    She has a history of a prior TOA in 10/08/13 on the same side that needed drainage, which was done by IR.   Review of Systems:   Pt complains of nausea.  Pt denies any vomiting, fever, chills, vaginal discharge.  Review of systems are otherwise negative   Past Medical History: Past Medical History  Diagnosis Date  . Anemia   . PID (pelvic inflammatory disease)   . Pregnancy induced hypertension   . Gestational diabetes     Past Surgical History: Past Surgical History  Procedure Laterality Date  . No past surgeries      Obstetrical History: OB History   Grav Para Term Preterm Abortions TAB SAB Ect Mult Living   5 5        5       Gynecological History: OB History   Grav Para Term Preterm Abortions TAB SAB Ect Mult Living   5 5        5       Social History: History   Social History  . Marital Status: Married    Spouse Name: N/A    Number of Children: N/A  . Years of Education: N/A   Social History Main Topics  . Smoking status: Never Smoker   . Smokeless tobacco: Never Used  . Alcohol Use: No  . Drug Use: No  . Sexual Activity: Yes   Other Topics Concern  . None   Social History Narrative  . None    Family History: Family History  Problem Relation Age of Onset  . Other Neg Hx   . Diabetes Father   . Hypertension Father     Allergies: Allergies  Allergen Reactions  . Zosyn [Piperacillin Sod-Tazobactam  So] Itching    Pt reports itching when receiving zosyn    Prescriptions prior to admission  Medication Sig Dispense Refill  . docusate sodium (COLACE) 100 MG capsule Take 1 capsule (100 mg total) by mouth 2 (two) times daily as needed.  30 capsule  2  . doxycycline (VIBRAMYCIN) 100 MG capsule Take 1 capsule (100 mg total) by mouth 2 (two) times daily.  28 capsule  0  . ferrous gluconate (FERGON) 324 MG tablet 1 tablet 2-3 times daily with orange juice  90 tablet  2  . HYDROcodone-acetaminophen (NORCO/VICODIN) 5-325 MG per tablet Take 1-2 tablets by mouth every 4 (four) hours as needed for moderate pain.  30 tablet  0  . ibuprofen (ADVIL,MOTRIN) 600 MG tablet Take 1 tablet (600 mg total) by mouth every 6 (six) hours as needed.  30 tablet  1  . metroNIDAZOLE (FLAGYL) 500 MG tablet Take 1 tablet (500 mg total) by mouth 2 (two) times daily.  28 tablet  0    Physical Exam: BP 105/70  Pulse 99  Temp(Src) 99.6 F (37.6 C) (Oral)  Resp 16  Ht 5\' 5"  (1.651 m)  Wt 191 lb 0.4 oz (86.648 kg)  BMI 31.79 kg/m2  SpO2 100%  LMP 06/27/2014  Head: Normocephalic, without obvious abnormality, atraumatic Lungs: clear to auscultation bilaterally Heart: regular rate and rhythm, S1, S2 normal, no murmur, click, rub or gallop Abdomen: distended, tender difusely, but worse in the right lower quadrant.  guarding present. Extremities: extremities normal, atraumatic, no cyanosis or edema Pulses: 2+ and symmetric Skin: Skin color, texture, turgor normal. No rashes or lesions             Labs on Admission:  Basic Metabolic Panel:  Recent Labs Lab 07/05/14 2135  NA 135*  K 3.8  CL 100  CO2 21  GLUCOSE 166*  BUN 12  CREATININE 0.64  CALCIUM 8.8   Liver Function Tests:  Recent Labs Lab 07/05/14 2135  AST 52*  ALT 34  ALKPHOS 109  BILITOT 0.8  PROT 7.3  ALBUMIN 3.7   No results found for this basename: LIPASE, AMYLASE,  in the last 168 hours No results found for this basename: AMMONIA,   in the last 168 hours CBC:  Recent Labs Lab 07/05/14 2135 07/09/14 2238  WBC 6.2 7.0  NEUTROABS 5.6 5.8  HGB 11.5* 11.8*  HCT 34.7* 34.4*  MCV 84.4 81.3  PLT 207 264    CBG: No results found for this basename: GLUCAP,  in the last 168 hours  Radiological Exams on Admission: [place] Transvaginal Non-ob  07/09/2014   CLINICAL DATA:  Tubo-ovarian abscess.  EXAM: ULTRASOUND PELVIS TRANSVAGINAL  TECHNIQUE: Transvaginal ultrasound examination of the pelvis was performed including evaluation of the uterus, ovaries, adnexal regions, and pelvic cul-de-sac.  COMPARISON:  07/05/2014  FINDINGS: Uterus  Measurements: 12 x 6 x 7 cm. No fibroids or other mass visualized.  Endometrium  Thickness: 7 mm.  No focal abnormality visualized.  Right ovary  The right ovary is not visible. A complex tubular or multi septated hypoechoic structure in the right adnexa has more hypoechoic contents, although still complicated by diffuse mid level echoes. The thick wall is hypervascular with suggestion of thickened endosalpingeal folds. The collection has enlarged from previous, now 10 x 6 x 7 cm - 8 x 5 x 4 cm previously.  Left ovary  Not visualized.  No adnexal mass.  Other findings:  No free fluid  IMPRESSION: Complex right adnexal cystic mass consistent with pyosalpinx or tubo-ovarian abscess. The collection has increased from 07/05/2014.   Electronically Signed   By: Tiburcio PeaJonathan  Watts M.D.   On: 07/09/2014 23:52     Assessment/Plan Present on Admission:  . TOA (tubo-ovarian abscess)  Will admit on IV antibiotics as has enlarged - cefotetan and doxycycline Will consult IR for drainage. Pain control. NPO.  Code Status: Full  Family Communication: husband in room   Time spent: 50 minutes  Candelaria CelesteJacob Stinson, DO Faculty Practice Attending Physician [facility] Attending Phone #: [phone]  **Disclaimer: This note may have been dictated with voice recognition software. Similar sounding words  can inadvertently be transcribed and this note may contain transcription errors which may not have been corrected upon publication of note.**

## 2014-07-10 NOTE — Progress Notes (Signed)
Patient ID: Kristin [name], female   DOB: 12/16/1970, 43 y.o.   MRN: [id number]   Referring Physician(s): Arnold,James G  Subjective:  Pt with moderate abd pain, occ nausea, low grade fever ; familiar to our service from prior pyosalpinx drainage x 2 on 10/05/13; request now received for drainage of recurrent right pyosalpinx/TOA.  Allergies: Zosyn  Medications: Prior to Admission medications   Medication Sig Start Date End Date Taking? Authorizing Provider  docusate sodium (COLACE) 100 MG capsule Take 1 capsule (100 mg total) by mouth 2 (two) times daily as needed. 07/09/14   Peggy Constant, MD  doxycycline (VIBRAMYCIN) 100 MG capsule Take 1 capsule (100 mg total) by mouth 2 (two) times daily. 07/09/14   Catalina AntiguaPeggy Constant, MD  ferrous gluconate (FERGON) 324 MG tablet 1 tablet 2-3 times daily with orange juice 06/11/14   Kermit Baloavid S Tysinger, PA-C  HYDROcodone-acetaminophen (NORCO/VICODIN) 5-325 MG per tablet Take 1-2 tablets by mouth every 4 (four) hours as needed for moderate pain. 07/09/14   Peggy Constant, MD  ibuprofen (ADVIL,MOTRIN) 600 MG tablet Take 1 tablet (600 mg total) by mouth every 6 (six) hours as needed. 07/09/14   Peggy Constant, MD  metroNIDAZOLE (FLAGYL) 500 MG tablet Take 1 tablet (500 mg total) by mouth 2 (two) times daily. 07/09/14 07/16/14  Catalina AntiguaPeggy Constant, MD    Review of Systems  Constitutional: Negative for fever and chills.  Respiratory: Negative for cough and shortness of breath.   Cardiovascular: Negative for chest pain.  Gastrointestinal: Positive for nausea, vomiting, abdominal pain, constipation and abdominal distention.  Genitourinary: Negative for dysuria and hematuria.       Vaginal spotting  Musculoskeletal: Positive for back pain.  Neurological:       Occ HA'S  Hematological: Does not bruise/bleed easily.  Psychiatric/Behavioral: The patient is nervous/anxious.     Vital Signs: BP 105/67  Pulse 105  Temp(Src) 98.6 F (37 C) (Oral)  Resp 18  SpO2 98%   LMP 06/27/2014  Physical Exam  Constitutional: She is oriented to person, place, and time. She appears well-developed and well-nourished.  Cardiovascular: Regular rhythm.   tachy  Pulmonary/Chest: Effort normal and breath sounds normal.  Abdominal: Soft. Bowel sounds are normal. There is tenderness.  Mildly distended  Musculoskeletal: Normal range of motion. She exhibits edema.  Neurological: She is alert and oriented to person, place, and time.    Imaging: Ct Pelvis W Contrast  07/10/2014   CLINICAL DATA:  Pyosalpinx.  Complex adnexal cystic mass followup.  EXAM: CT PELVIS WITH CONTRAST  TECHNIQUE: Multidetector CT imaging of the pelvis was performed using the standard protocol following the bolus administration of intravenous contrast.  CONTRAST:  100mL OMNIPAQUE IOHEXOL 300 MG/ML  SOLN  COMPARISON:  Pelvic ultrasound 07/09/2014 and CT abdomen pelvis 10/16/2013.  FINDINGS: A complex low-attenuation collection in the central and right anatomic pelvis appears somewhat tubular in configuration, making accurate measurement difficult. Largest transaxial dimensions are 5.4 x 7.8 cm. Surrounding haziness and stranding. On the prior exam of 10/16/2013, percutaneous drains were seen in the same location. Uterus is displaced slightly to the left.  Visualized portions of the bowel are grossly unremarkable. Calcification is seen along the bladder wall, as before. Inferior right periaortic lymph node measures 9 mm. No definite dependent pelvic free fluid.  No worrisome lytic or sclerotic lesions.  IMPRESSION: 1. Complex fluid collection in the central anatomic pelvis and right adnexa appears somewhat tubular in configuration, indicative of a tubo-ovarian abscess. 2. Bladder wall calcification may be post  infectious in etiology (e.g. tuberculosis or schistosomiasis).   Electronically Signed   By: Leanna BattlesMelinda  Blietz M.D.   On: 07/10/2014 12:22   [place] Transvaginal Non-ob  07/09/2014   CLINICAL DATA:  Tubo-ovarian  abscess.  EXAM: ULTRASOUND PELVIS TRANSVAGINAL  TECHNIQUE: Transvaginal ultrasound examination of the pelvis was performed including evaluation of the uterus, ovaries, adnexal regions, and pelvic cul-de-sac.  COMPARISON:  07/05/2014  FINDINGS: Uterus  Measurements: 12 x 6 x 7 cm. No fibroids or other mass visualized.  Endometrium  Thickness: 7 mm.  No focal abnormality visualized.  Right ovary  The right ovary is not visible. A complex tubular or multi septated hypoechoic structure in the right adnexa has more hypoechoic contents, although still complicated by diffuse mid level echoes. The thick wall is hypervascular with suggestion of thickened endosalpingeal folds. The collection has enlarged from previous, now 10 x 6 x 7 cm - 8 x 5 x 4 cm previously.  Left ovary  Not visualized.  No adnexal mass.  Other findings:  No free fluid  IMPRESSION: Complex right adnexal cystic mass consistent with pyosalpinx or tubo-ovarian abscess. The collection has increased from 07/05/2014.   Electronically Signed   By: Tiburcio PeaJonathan  Watts M.D.   On: 07/09/2014 23:52    Labs:  CBC:  Recent Labs  10/05/13 0059 12/25/13 1544 06/11/14 1549 07/05/14 2135 07/09/14 2238  WBC 6.0 5.0  --  6.2 7.0  HGB 10.2* 11.7* 10.6* 11.5* 11.8*  HCT 31.3* 35.1*  --  34.7* 34.4*  PLT 409* 258  --  207 264    COAGS:  Recent Labs  07/10/14 1123  INR 1.16    BMP:  Recent Labs  07/18/13 0040 09/18/13 0400 09/24/13 1856 10/05/13 0059 12/25/13 1544 07/05/14 2135  NA 137 136* 137 137 137 135*  K 3.4* 3.8 3.8 3.5* 4.4 3.8  CL 105 98 99 101 105 100  CO2 24 23  --  25 23 21   GLUCOSE 119* 138* 117* 129* 83 166*  BUN 13 8 9 9 14 12   CALCIUM 9.1 8.8  --  8.6 9.0 8.8  CREATININE 0.66 0.60 0.70 0.59 0.85 0.64  GFRNONAA >90 >90  --  >90  --  >90  GFRAA >90 >90  --  >90  --  >90    LIVER FUNCTION TESTS:  Recent Labs  09/18/13 0400 10/05/13 0059 12/25/13 1544 07/05/14 2135  BILITOT 0.5 0.3 0.4 0.8  AST 18 13 21  52*    ALT 15 12 14  34  ALKPHOS 85 60 72 109  PROT 7.2 6.0 7.0 7.3  ALBUMIN 3.3* 2.9* 4.0 3.7    Assessment and Plan: Pt with abd/pelvic pain, hx PID, low grade fever, recurrent right pyosalpinx/TOA (prior cath drainage 09/2013); request now received for CT guided repeat drainage of pyosalpinx/TOA. Imaging studies were reviewed by Dr. Fredia SorrowYamagata. Details/risks of procedure d/w pt with her understanding and consent. [facility]       Signed: Chinita PesterALLRED,D KEVIN 07/10/2014, 2:17 PM

## 2014-07-10 NOTE — MAU Provider Note (Signed)
Attestation of Attending Supervision of Advanced Practitioner (PA/CNM/NP): Evaluation and management procedures were performed by the Advanced Practitioner under my supervision and collaboration.  I have reviewed the Advanced Practitioner's note and chart, and I agree with the management and plan.  Jacob Stinson, DO Attending Physician Faculty Practice, [facility]  

## 2014-07-10 NOTE — Procedures (Signed)
Procedure:  CT guided drainage x 2 of right TOA/pyosalpinx Findings:  2 dominant right adnexal pockets.  Both contained purulent fluid.  Parallel 10 Fr drains placed in dominant pockets.  Similar to procedure in January.  Will leave both drains to suction bulb drainage.  Sample sent for culture.

## 2014-07-10 NOTE — Discharge Instructions (Signed)
Percutaneous Abscess Drain, Care After  Refer to this sheet in the next few weeks. These instructions provide you with information on caring for yourself after your procedure. Your health care provider may also give you more specific instructions. Your treatment has been planned according to current medical practices, but problems sometimes occur. Call your health care provider if you have any problems or questions after your procedure. WHAT TO EXPECT AFTER THE PROCEDURE After your procedure, it is typical to have the following:   A small amount of discomfort in the area where the drainage tube was placed.  A small amount of bruising around the area where the drainage tube was placed.  Sleepiness and fatigue for the rest of the day from the medicines used. HOME CARE INSTRUCTIONS  Rest at home for 1-2 days following your procedure or as directed by your health care provider.  If you go home right after the procedure, plan to have someone with you for 24 hours.  Do not take a bathor shower for 24 hours after your procedure.  Take medicines only as directed by your health care provider. Ask your health care provider when you can resume taking any normal medicines.  Change bandages (dressings) as directed.   You may be told to record the amount of drainage from the bag every time you empty it. Follow your health care provider's directions for emptying the bag. Write down the amount of drainage, the date, and the time you emptied it.  Call your health care provider when the drain is putting out less than 10 mL of drainage per day for 2-3 days in a row or as directed by your health care provider.  Follow your health care provider's instructions for cleaning the drainage tube. You may need to clean the tube every day so that it does not clog. SEEK MEDICAL CARE IF:  You have increased bleeding (more than a small spot) from the site where the drainage tube was placed.  You have redness,  swelling, or increasing pain around the site where the drainage tube was placed.  You notice a discharge or bad smell coming from the site where the drainage tube was placed.  You have a fever or chills.  You have pain that is not helped by medicine.  SEEK IMMEDIATE MEDICAL CARE IF:  There is leakage around the drainage tube.  The drainage tube pulls out.  You suddenly stop having drainage from the tube.  You suddenly have blood in the drainage fluid.  You become dizzy or faint.  You develop a rash.   You have nausea or vomiting.  You have difficulty breathing, feel short of breath, or feel faint.   You develop chest pain.  You have problems with your speech or vision.  You have trouble balancing or moving your arms or legs. Document Released: 01/14/2014 Document Reviewed: 10/19/2013 [facility] Patient Information 2015 [place]. This information is not intended to replace advice given to you by your health care provider. Make sure you discuss any questions you have with your health care provider. Percutaneous Abscess Drain An abscess is a collection of infected fluid inside the body. Your health care provider may decide to remove or drain the infected fluid from the area by placing a thin needle into the abscess. Usually, a small tube is left in place to drain the abscess fluid. The abscess fluid may take a few days to drain. LET [facility] CARE PROVIDER KNOW ABOUT:  Any allergies you have.  All medicines you are taking, including vitamins, herbs, eye drops, creams, and over-the-counter medicines. This includes steroid medicines by mouth or cream.  Previous problems you or members of your family have had with the use of anesthetics.  Any blood disorders you have.  Previous surgeries you have had.  Possibility of pregnancy, if this applies.  Medical conditions you have.  Any history of smoking. RISKS AND COMPLICATIONS Generally, this is a safe procedure.  However, problems can occur and include:   Infection.  Allergic reaction to materials used (such as contrast dye).  Damage to a nearby organ or tissue.  Bleeding.  Blockage of a tube placed to drain the abscess, requiring placement of a new drainage tube.  A need to repeat the procedure.  Failure of the procedure to adequately drain the abscess, requiring an open surgical procedure to do so. BEFORE THE PROCEDURE   Ask your health care provider about:  Changing or stopping your regular medicines. This is especially important if you are taking diabetes medicines or blood thinners.  Taking medicines such as aspirin and ibuprofen. These medicines can thin your blood. Do not take these medicines before your procedure if your health care provider asks you not to.  Your health care provider may do some blood or urine tests. These will help your health care provider learn how well your kidneys and liver are working and how well your blood clots.  Do not eat or drink anything after midnight on the night before the procedure or as directed by your health care provider.  Make arrangements for someone to drive you home after the procedure.  PROCEDURE   An IV tube will be placed in your arm. Medicine will be able to flow directly into your body through this tube.  You will lie on an X-ray table.  Your heart rate, blood pressure, and breathing will be monitored.   Your oxygen level will also be watched during the procedure. Supplemental oxygen may be given if necessary.  The skin around the area where the drainage tube (catheter) will be placed will be cleaned and numbed.  A small cut (incision) will then be made to insert the drainage tube. The drainage tube will be inserted using X-ray or CT scan to help direct where it should be placed.  The drainage tube will be guided into the abscess to drain the infected fluid.  The drainage tube may stay in place and be connected to a bag  outside your body. It will stay until the fluid has stopped draining and the infection is gone. AFTER THE PROCEDURE  You will be taken to a recovery area where you will stay until the medicines have worn off.  You will stay in bed for several hours.  Your progress will be monitored.   Your blood pressure and pulse will be checked often.   The area of the incision will be checked often.  You may have some pain or feel sick. Tell your health care provider.  As you begin to feel better, you may be given ice, fluids, and food.   When you can walk, drink, eat, and use the bathroom, you may be able to go home. Document Released: 01/14/2014 Document Reviewed: 10/19/2013 [facility] Patient Information 2015 [place]. This information is not intended to replace advice given to you by your health care provider. Make sure you discuss any questions you have with your health care provider. Conscious Sedation Sedation is the use of medicines to promote  relaxation and relieve discomfort and anxiety. Conscious sedation is a type of sedation. Under conscious sedation you are less alert than normal but are still able to respond to instructions or stimulation. Conscious sedation is used during short medical and dental procedures. It is milder than deep sedation or general anesthesia and allows you to return to your regular activities sooner.  [facility] PROVID[facility] KNOW ABOUT:   Any allergies you have.  All medicines you are taking, including vitamins, herbs, eye drops, creams, and over-the-counter medicines.  Use of steroids (by mouth or creams).  Previous problems you or members of your family have had with the use of anesthetics.  Any blood disorders you have.  Previous surgeries you have had.  Medical conditions you have.  Possibility of pregnancy, if this applies.  Use of cigarettes, alcohol, or illegal drugs. RISKS AND COMPLICATIONS Generally, this is a safe procedure.  However, as with any procedure, problems can occur. Possible problems include:  Oversedation.  Trouble breathing on your own. You may need to have a breathing tube until you are awake and breathing on your own.  Allergic reaction to any of the medicines used for the procedure. BEFORE THE PROCEDURE  You may have blood tests done. These tests can help show how well your kidneys and liver are working. They can also show how well your blood clots.  A physical exam will be done.  Only take medicines as directed by your health care provider. You may need to stop taking medicines (such as blood thinners, aspirin, or nonsteroidal anti-inflammatory drugs) before the procedure.   Do not eat or drink at least 6 hours before the procedure or as directed by your health care provider.  Arrange for a responsible adult, family member, or friend to take you home after the procedure. He or she should stay with you for at least 24 hours after the procedure, until the medicine has worn off. PROCEDURE   An intravenous (IV) catheter will be inserted into one of your veins. Medicine will be able to flow directly into your body through this catheter. You may be given medicine through this tube to help prevent pain and help you relax.  The medical or dental procedure will be done. AFTER THE PROCEDURE  You will stay in a recovery area until the medicine has worn off. Your blood pressure and pulse will be checked.   Depending on the procedure you had, you may be allowed to go home when you can tolerate liquids and your pain is under control. Document Released: 05/25/2001 Document Revised: 09/04/2013 Document Reviewed: 05/07/2013 [facility] Patient Information 2015 [place], [place]. This information is not intended to replace advice given to you by your health care provider. Make sure you discuss any questions you have with your health care provider.

## 2014-07-10 NOTE — Progress Notes (Signed)
[facility]  [place].  Request confirmed.  Ardyth GalAnderson, Mechel Haggard Ann, RN 07/10/2014

## 2014-07-11 MED ORDER — PANTOPRAZOLE SODIUM 40 MG PO TBEC
40.0000 mg | DELAYED_RELEASE_TABLET | Freq: Every day | ORAL | Status: DC
  Administered 2014-07-11 – 2014-07-18 (×8): 40 mg via ORAL
  Filled 2014-07-11 (×8): qty 1

## 2014-07-11 MED ORDER — HYDROMORPHONE HCL 2 MG PO TABS
2.0000 mg | ORAL_TABLET | ORAL | Status: DC | PRN
  Administered 2014-07-11 – 2014-07-18 (×12): 2 mg via ORAL
  Filled 2014-07-11 (×12): qty 1

## 2014-07-11 MED ORDER — POLYETHYLENE GLYCOL 3350 17 G PO PACK
17.0000 g | PACK | Freq: Every day | ORAL | Status: DC
  Administered 2014-07-11 – 2014-07-12 (×2): 17 g via ORAL
  Filled 2014-07-11 (×2): qty 1

## 2014-07-11 MED FILL — Ondansetron HCl Inj 4 MG/2ML (2 MG/ML): INTRAMUSCULAR | Qty: 2 | Status: AC

## 2014-07-11 NOTE — Progress Notes (Signed)
Patient ID: Kristin [name], female   DOB: 12/12/1970, 43 y.o.   MRN: [id number]    Pt with pyosalpinx/tubo ovarian abscesses Drain placed for both 10/28  Pt some better per RN Drains flushing well per RN Sites are clean and dry Output 20cc/drain 10/28 10 cc per drain so far today per chart Cxs: abundant wbcs; Gr+cocci afeb  Will follow via phone Continue to flush per shift Record output Will need Re CT when output less than 10cc / drain  [facility] has OP drain clinic every Wed If pt goes home with drain We can arrange for follow up with clinic Let us know Call [phone]

## 2014-07-11 NOTE — Progress Notes (Signed)
Right pyosalpinx /TOA  Subjective: Patient reports nausea.  Minimal gas, complaining of abdominal pain, bloating, belching, no flattus  Objective: I have reviewed patient's vital signs, intake and output, medications and labs. Filed Vitals:   07/10/14 2121 07/11/14 0122 07/11/14 0532 07/11/14 0609  BP: 115/66 116/75 115/72 117/76  Pulse: 104 95 92 84  Temp: 98.9 F (37.2 C) 98.6 F (37 C) 98.4 F (36.9 C)   TempSrc: Oral Oral Oral   Resp: 16 18 18 18   Height:      Weight:      SpO2: 100% 100% 96%      GI: distended moderately, soft, protuberant, consist4nt with an ileus   Assessment/Plan: Right pyosalpinx/TOA Secondary ileus:evolving  S/P IR drainage last evening   Continue cefotan and doxy    LOS: 2 days    Topher Buenaventura H 07/11/2014, 7:46 AM

## 2014-07-11 NOTE — Progress Notes (Signed)
Patient ID: Kristin [name], female   DOB: 11/12/1970, 43 y.o.   MRN: [id number]    Pt with pyosalpinx/tubo ovarian abscesses Drain placed for both 10/28  Pt some better per RN Drains flushing well per RN Sites are clean and dry Output 20cc/drain 10/28 10 cc per drain so far today per chart Cxs: abundant wbcs; Gr+cocci afeb  Will follow via phone Continue to flush per shift Record output Will need Re CT when output less than 10cc / drain  IR has OP drain clinic every Wed If pt goes home with drain We can arrange for follow up with clinic Let us know Call [phone]   

## 2014-07-12 ENCOUNTER — Inpatient Hospital Stay (HOSPITAL_COMMUNITY): Payer: 59

## 2014-07-12 LAB — CULTURE, ROUTINE-ABSCESS: SPECIAL REQUESTS: NORMAL

## 2014-07-12 LAB — CBC WITH DIFFERENTIAL/PLATELET
BASOS ABS: 0.1 10*3/uL (ref 0.0–0.1)
BASOS PCT: 0 % (ref 0–1)
EOS ABS: 0.1 10*3/uL (ref 0.0–0.7)
Eosinophils Relative: 1 % (ref 0–5)
HCT: 32.1 % — ABNORMAL LOW (ref 36.0–46.0)
Hemoglobin: 11.2 g/dL — ABNORMAL LOW (ref 12.0–15.0)
LYMPHS ABS: 1.8 10*3/uL (ref 0.7–4.0)
Lymphocytes Relative: 9 % — ABNORMAL LOW (ref 12–46)
MCH: 28.3 pg (ref 26.0–34.0)
MCHC: 34.9 g/dL (ref 30.0–36.0)
MCV: 81.1 fL (ref 78.0–100.0)
Monocytes Absolute: 1 10*3/uL (ref 0.1–1.0)
Monocytes Relative: 5 % (ref 3–12)
NEUTROS PCT: 85 % — AB (ref 43–77)
Neutro Abs: 17.7 10*3/uL — ABNORMAL HIGH (ref 1.7–7.7)
PLATELETS: 373 10*3/uL (ref 150–400)
RBC: 3.96 MIL/uL (ref 3.87–5.11)
RDW: 14.6 % (ref 11.5–15.5)
WBC: 20.8 10*3/uL — ABNORMAL HIGH (ref 4.0–10.5)

## 2014-07-12 MED ORDER — ENSURE COMPLETE PO LIQD
237.0000 mL | Freq: Four times a day (QID) | ORAL | Status: DC
  Administered 2014-07-12 – 2014-07-17 (×9): 237 mL via ORAL
  Filled 2014-07-12 (×28): qty 237

## 2014-07-12 NOTE — Progress Notes (Addendum)
Subjective: Patient reports nausea and vomiting.  She reports that the last episode was yesterday morning at 1000-1100.  She reports continued anorexia but, has been able to tolerate liquids yesterday.  She denies flatus but, has been burping.  Her pain is improving slowly.   Objective: I have reviewed patient's vital signs, intake and output, medications, labs, microbiology and radiology results.  General: alert and no distress GI: abd still quite distended.  There are +BS.  No rebound or guarding. I/O last 3 completed shifts: In: 2704.9 [P.O.:960; I.V.:1360.9; Other:84; IV Piggyback:300] Out: 4681.5 [Urine:4200; Emesis/NG output:400; Drains:81.5]   Abscess cx: gram neg rods  Assessment/Plan: Recurrent TOA s/p IR drainage 10/28.  Also with significant ileus most likely related to bowel inflammation  Ensure with meals Watch for s/sx of return of bowel function Keep IV atbx.     LOS: 3 days    HARRAWAY-SMITH, Codi Kertz 07/12/2014, 7:50 AM

## 2014-07-12 NOTE — Progress Notes (Signed)
Agree.  Will continue to follow drain output. 

## 2014-07-12 NOTE — Progress Notes (Signed)
Patient ID: Kristin [name], female   DOB: 11/04/1970, 43 y.o.   MRN: [id number]   Referring Physician(s): Dr. Debroah Loop  Subjective: Nurse reports pt is feeling a little better; ambulating more; occ burping but no BM; voiding ok; intermittent N/V yesterday   Allergies: Zosyn  Medications: Prior to Admission medications   Medication Sig Start Date End Date Taking? Authorizing Provider  docusate sodium (COLACE) 100 MG capsule Take 1 capsule (100 mg total) by mouth 2 (two) times daily as needed. 07/09/14  Yes Peggy Constant, MD  doxycycline (VIBRAMYCIN) 100 MG capsule Take 1 capsule (100 mg total) by mouth 2 (two) times daily. 07/09/14  Yes Peggy Constant, MD  ferrous gluconate (FERGON) 324 MG tablet 1 tablet 2-3 times daily with orange juice 06/11/14  Yes Kermit Balo Tysinger, PA-C  HYDROcodone-acetaminophen (NORCO/VICODIN) 5-325 MG per tablet Take 1-2 tablets by mouth every 4 (four) hours as needed for moderate pain. 07/09/14  Yes Peggy Constant, MD  ibuprofen (ADVIL,MOTRIN) 600 MG tablet Take 1 tablet (600 mg total) by mouth every 6 (six) hours as needed. 07/09/14  Yes Peggy Constant, MD  metroNIDAZOLE (FLAGYL) 500 MG tablet Take 1 tablet (500 mg total) by mouth 2 (two) times daily. 07/09/14 07/16/14 Yes Peggy Constant, MD    Review of Systems see above  Vital Signs: BP 111/88  Pulse 90  Temp(Src) 99 F (37.2 C) (Oral)  Resp 20  Ht 5\' 5"  (1.651 m)  Wt 191 lb 0.4 oz (86.648 kg)  BMI 31.79 kg/m2  SpO2 100%  LMP 06/27/2014  Physical Exam drains intact per nursing, outputs 20-25 cc's last 24 hrs serosang fluid; cxs- e coli  Imaging: Ct Pelvis W Contrast  07/10/2014   CLINICAL DATA:  Pyosalpinx.  Complex adnexal cystic mass followup.  EXAM: CT PELVIS WITH CONTRAST  TECHNIQUE: Multidetector CT imaging of the pelvis was performed using the standard protocol following the bolus administration of intravenous contrast.  CONTRAST:  OMNIPAQUE IOHEXOL 300 MG/ML  SOLN  COMPARISON:  Pelvic  ultrasound 07/09/2014 and CT abdomen pelvis 10/16/2013.  FINDINGS: A complex low-attenuation collection in the central and right anatomic pelvis appears somewhat tubular in configuration, making accurate measurement difficult. Largest transaxial dimensions are 5.4 x 7.8 cm. Surrounding haziness and stranding. On the prior exam of 10/16/2013, percutaneous drains were seen in the same location. Uterus is displaced slightly to the left.  Visualized portions of the bowel are grossly unremarkable. Calcification is seen along the bladder wall, as before. Inferior right periaortic lymph node measures 9 mm. No definite dependent pelvic free fluid.  No worrisome lytic or sclerotic lesions.  IMPRESSION: 1. Complex fluid collection in the central anatomic pelvis and right adnexa appears somewhat tubular in configuration, indicative of a tubo-ovarian abscess. 2. Bladder wall calcification may be post infectious in etiology (e.g. tuberculosis or schistosomiasis).   Electronically Signed   By: Leanna Battles M.D.   On: 07/10/2014 12:22   US Transvaginal Non-ob  07/09/2014   CLINICAL DATA:  Tubo-ovarian abscess.  EXAM: ULTRASOUND PELVIS TRANSVAGINAL  TECHNIQUE: Transvaginal ultrasound examination of the pelvis was performed including evaluation of the uterus, ovaries, adnexal regions, and pelvic cul-de-sac.  COMPARISON:  07/05/2014  FINDINGS: Uterus  Measurements: 12 x 6 x 7 cm. No fibroids or other mass visualized.  Endometrium  Thickness: 7 mm.  No focal abnormality visualized.  Right ovary  The right ovary is not visible. A complex tubular or multi septated hypoechoic structure in the right adnexa has more hypoechoic contents, although still complicated  by diffuse mid level echoes. The thick wall is hypervascular with suggestion of thickened endosalpingeal folds. The collection has enlarged from previous, now 10 x 6 x 7 cm - 8 x 5 x 4 cm previously.  Left ovary  Not visualized.  No adnexal mass.  Other findings:  No free  fluid  IMPRESSION: Complex right adnexal cystic mass consistent with pyosalpinx or tubo-ovarian abscess. The collection has increased from 07/05/2014.   Electronically Signed   By: Tiburcio PeaJonathan  Watts M.D.   On: 07/09/2014 23:52   Ct Image Guided Fluid Drain By Catheter  07/10/2014   CLINICAL DATA:  Recurrent pyosalpinx and tubo-ovarian abscesses.  EXAM: CT GUIDED DRAINAGE OF PELVIC TUBO-OVARIAN ABSCESS X 2  COMPARISON:  CT of the pelvis performed earlier today.  ANESTHESIA/SEDATION: 5.0 Mg IV Versed 150 mcg IV Fentanyl  Total Moderate Sedation Time:  34 minutes  PROCEDURE: The procedure, risks, benefits, and alternatives were explained to the patient. Questions regarding the procedure were encouraged and answered. The patient understands and consents to the procedure.  The right anterior lower abdominal wall was prepped with Betadine in a sterile fashion, and a sterile drape was applied covering the operative field. A sterile gown and sterile gloves were used for the procedure. Local anesthesia was provided with 1% Lidocaine.  CT was performed in a supine position with the right side rolled up slightly. An 18 gauge trocar needle was advanced under CT guidance into a more lateral and anteriorly positioned abscess. Aspiration was performed through the needle and a sample submitted for culture analysis. A guidewire was then advanced through the needle. The tract was dilated and a 10 French percutaneous drainage catheter advanced. Catheter position was confirmed by CT. The catheter was flushed and connected to a suction bulb.  A second more medial and posterior collection was then sampled by advancement of an 18 gauge trocar needle under CT guidance. After aspiration, a guidewire was advanced into the collection. The tract was dilated and a second 10 French percutaneous drainage catheter advanced over the wire. This was formed and CT performed to confirm position. The catheter was flushed with saline and connected to a  suction bulb.  Both catheters were secured at the skin with Prolene retention sutures. An overlying dressing was applied.  COMPLICATIONS: None  FINDINGS: CT demonstrates 2 dominant abscess pockets in the right pelvis which do not appear to communicate with each other by CT or on the recent ultrasound. Similar to previous CT drainage, 2 separate drainage catheters were needed to treat the recurrent abscesses. Both drains are draining purulent fluid after placement.  IMPRESSION: CT-guided percutaneous drainage by catheter of 2 separate dominant abscesses in the right pelvis consistent with recurrent tubo-ovarian abscess/pyosalpinx.   Electronically Signed   By: Irish LackGlenn  Yamagata M.D.   On: 07/10/2014 17:12   Ct Image Guided Drainage By Percutaneous Catheter  07/10/2014   CLINICAL DATA:  Recurrent pyosalpinx and tubo-ovarian abscesses.  EXAM: CT GUIDED DRAINAGE OF PELVIC TUBO-OVARIAN ABSCESS X 2  COMPARISON:  CT of the pelvis performed earlier today.  ANESTHESIA/SEDATION: 5.0 Mg IV Versed 150 mcg IV Fentanyl  Total Moderate Sedation Time:  34 minutes  PROCEDURE: The procedure, risks, benefits, and alternatives were explained to the patient. Questions regarding the procedure were encouraged and answered. The patient understands and consents to the procedure.  The right anterior lower abdominal wall was prepped with Betadine in a sterile fashion, and a sterile drape was applied covering the operative field. A sterile gown and  sterile gloves were used for the procedure. Local anesthesia was provided with 1% Lidocaine.  CT was performed in a supine position with the right side rolled up slightly. An 18 gauge trocar needle was advanced under CT guidance into a more lateral and anteriorly positioned abscess. Aspiration was performed through the needle and a sample submitted for culture analysis. A guidewire was then advanced through the needle. The tract was dilated and a 10 French percutaneous drainage catheter advanced.  Catheter position was confirmed by CT. The catheter was flushed and connected to a suction bulb.  A second more medial and posterior collection was then sampled by advancement of an 18 gauge trocar needle under CT guidance. After aspiration, a guidewire was advanced into the collection. The tract was dilated and a second 10 French percutaneous drainage catheter advanced over the wire. This was formed and CT performed to confirm position. The catheter was flushed with saline and connected to a suction bulb.  Both catheters were secured at the skin with Prolene retention sutures. An overlying dressing was applied.  COMPLICATIONS: None  FINDINGS: CT demonstrates 2 dominant abscess pockets in the right pelvis which do not appear to communicate with each other by CT or on the recent ultrasound. Similar to previous CT drainage, 2 separate drainage catheters were needed to treat the recurrent abscesses. Both drains are draining purulent fluid after placement.  IMPRESSION: CT-guided percutaneous drainage by catheter of 2 separate dominant abscesses in the right pelvis consistent with recurrent tubo-ovarian abscess/pyosalpinx.   Electronically Signed   By: Irish LackGlenn  Yamagata M.D.   On: 07/10/2014 17:12    Labs:  CBC:  Recent Labs  10/05/13 0059 12/25/13 1544 06/11/14 1549 07/05/14 2135 07/09/14 2238  WBC 6.0 5.0  --  6.2 7.0  HGB 10.2* 11.7* 10.6* 11.5* 11.8*  HCT 31.3* 35.1*  --  34.7* 34.4*  PLT 409* 258  --  207 264    COAGS:  Recent Labs  07/10/14 1123  INR 1.16    BMP:  Recent Labs  07/18/13 0040 09/18/13 0400 09/24/13 1856 10/05/13 0059 12/25/13 1544 07/05/14 2135  NA 137 136* 137 137 137 135*  K 3.4* 3.8 3.8 3.5* 4.4 3.8  CL 105 98 99 101 105 100  CO2 24 23  --  25 23 21   GLUCOSE 119* 138* 117* 129* 83 166*  BUN 13 8 9 9 14 12   CALCIUM 9.1 8.8  --  8.6 9.0 8.8  CREATININE 0.66 0.60 0.70 0.59 0.85 0.64  GFRNONAA >90 >90  --  >90  --  >90  GFRAA >90 >90  --  >90  --  >90     LIVER FUNCTION TESTS:  Recent Labs  09/18/13 0400 10/05/13 0059 12/25/13 1544 07/05/14 2135  BILITOT 0.5 0.3 0.4 0.8  AST 18 13 21  52*  ALT 15 12 14  34  ALKPHOS 85 60 72 109  PROT 7.2 6.0 7.0 7.3  ALBUMIN 3.3* 2.9* 4.0 3.7    Assessment and Plan: S/p drainage x2 of recurrent right pyosalpinx/TOA 10/28 ; cx's - E coli- consider ID consult; cont drain irrigation; monitor labs; check f/u CT once output <10-15 cc's/24 hrs excluding flush amt; other plans as per GYN        Signed: ALLRED,D KEVIN 07/12/2014, 9:36 AM

## 2014-07-13 MED ORDER — METOCLOPRAMIDE HCL 5 MG/ML IJ SOLN
10.0000 mg | Freq: Four times a day (QID) | INTRAMUSCULAR | Status: AC
  Administered 2014-07-13 – 2014-07-15 (×7): 10 mg via INTRAVENOUS
  Filled 2014-07-13 (×7): qty 2

## 2014-07-13 MED ORDER — POLYETHYLENE GLYCOL 3350 17 G PO PACK
17.0000 g | PACK | Freq: Every day | ORAL | Status: DC
Start: 2014-07-13 — End: 2014-07-17
  Administered 2014-07-13 – 2014-07-16 (×3): 17 g via ORAL
  Filled 2014-07-13 (×3): qty 1

## 2014-07-13 MED ORDER — BISACODYL 10 MG RE SUPP
10.0000 mg | Freq: Every day | RECTAL | Status: DC | PRN
  Administered 2014-07-13: 10 mg via RECTAL
  Filled 2014-07-13: qty 1

## 2014-07-13 NOTE — Progress Notes (Signed)
Subjective: Patient reports nausea, vomiting, + flatus and no problems voiding.    Objective: I have reviewed patient's vital signs, intake and output, medications, labs and radiology results.  General: alert, cooperative and appears stated age GI: distended, but softer, minimally tender Extremities: extremities normal, atraumatic, no cyanosis or edema  Drains 81.5 cc yesterday  CBC    Component Value Date/Time   WBC 20.8* 07/12/2014 1028   RBC 3.96 07/12/2014 1028   RBC 4.16 07/18/2013 0048   HGB 11.2* 07/12/2014 1028   HGB 10.6* 06/11/2014 1549   HCT 32.1* 07/12/2014 1028   PLT 373 07/12/2014 1028   MCV 81.1 07/12/2014 1028   MCH 28.3 07/12/2014 1028   MCHC 34.9 07/12/2014 1028   RDW 14.6 07/12/2014 1028   LYMPHSABS 1.8 07/12/2014 1028   MONOABS 1.0 07/12/2014 1028   EOSABS 0.1 07/12/2014 1028   BASOSABS 0.1 07/12/2014 1028     Dg Abd 2 Views  07/12/2014   CLINICAL DATA:  Emesis for 2 days with abdominal distention  EXAM: ABDOMEN - 2 VIEW  COMPARISON:  07/10/2014  FINDINGS: Scattered large and small bowel gas is noted. There is distension of multiple small bowel loops consistent with a small-bowel ileus or partial small bowel obstruction. Two drainage catheters are noted in the pelvis consistent with the given clinical history. No free air is seen.  IMPRESSION: Significant increase in the degree of small bowel dilatation. This may represent an ileus or partial small bowel obstruction. Correlation with the physical exam is recommended.   Electronically Signed   By: Alcide CleverMark  Lukens M.D.   On: 07/12/2014 19:32     Assessment/Plan: Ileus--slowly improving. PID-s/p IR drainage. Continue inpatient IV abx until improved more.    LOS: 4 days    Alvan Culpepper S 07/13/2014, 7:23 AM

## 2014-07-13 NOTE — Progress Notes (Signed)
Day 3 s/p IR drainage of Right TOA by placement of pigtail catheter  Subjective: Patient reports nausea, vomiting, + flatus and no problems voiding.  Had BM yesterday, had flatus today infrequently . Vomited up PO intake 8 pm then tolerated ensure 200 cc. + Burping.   Objective: I have reviewed patient's vital signs, intake and output and medications.  General: alert, cooperative and no distress Resp: clear to auscultation bilaterally GI: normal findings: hypoactive BS, noted exclusively in ruq. ABD full but soft, no guarding or rebound. and abnormal findings:  distended and hypoactive bowel sounds  CBC Latest Ref Rng 07/12/2014 07/09/2014 07/05/2014  WBC 4.0 - 10.5 K/uL 20.8(H) 7.0 6.2  Hemoglobin 12.0 - 15.0 g/dL 11.2(L) 11.8(L) 11.5(L)  Hematocrit 36.0 - 46.0 % 32.1(L) 34.4(L) 34.7(L)  Platelets 150 - 400 K/uL 373 264 207    BMET    Component Value Date/Time   NA 135* 07/05/2014 2135   K 3.8 07/05/2014 2135   CL 100 07/05/2014 2135   CO2 21 07/05/2014 2135   GLUCOSE 166* 07/05/2014 2135   BUN 12 07/05/2014 2135   CREATININE 0.64 07/05/2014 2135   CREATININE 0.85 12/25/2013 1544   CALCIUM 8.8 07/05/2014 2135   GFRNONAA >90 07/05/2014 2135   GFRAA >90 07/05/2014 2135   Culture E Coli from IR drainage culture   Assessment/Plan: Right TOA culture + E Coli.  Day 3 s/p IR drainage with hypoactive BS/ileus , no evidence of acute abdomen  Plan: continue PO intake,            Add IV reglan            Dulcolax suppository Rechk cbc, bmet in am  LOS: 4 days    Marylee Belzer V 07/13/2014, 11:47 PM

## 2014-07-14 LAB — CBC
HCT: 28.1 % — ABNORMAL LOW (ref 36.0–46.0)
Hemoglobin: 9.6 g/dL — ABNORMAL LOW (ref 12.0–15.0)
MCH: 27.3 pg (ref 26.0–34.0)
MCHC: 34.2 g/dL (ref 30.0–36.0)
MCV: 79.8 fL (ref 78.0–100.0)
PLATELETS: 371 10*3/uL (ref 150–400)
RBC: 3.52 MIL/uL — AB (ref 3.87–5.11)
RDW: 14.5 % (ref 11.5–15.5)
WBC: 16.1 10*3/uL — AB (ref 4.0–10.5)

## 2014-07-14 LAB — COMPREHENSIVE METABOLIC PANEL
ALT: 17 U/L (ref 0–35)
AST: 18 U/L (ref 0–37)
Albumin: 2.1 g/dL — ABNORMAL LOW (ref 3.5–5.2)
Alkaline Phosphatase: 93 U/L (ref 39–117)
Anion gap: 10 (ref 5–15)
BILIRUBIN TOTAL: 0.6 mg/dL (ref 0.3–1.2)
BUN: 4 mg/dL — ABNORMAL LOW (ref 6–23)
CHLORIDE: 94 meq/L — AB (ref 96–112)
CO2: 30 mEq/L (ref 19–32)
Calcium: 8.5 mg/dL (ref 8.4–10.5)
Creatinine, Ser: 0.6 mg/dL (ref 0.50–1.10)
GFR calc Af Amer: >90 mL/min (ref >90)
GFR calc non Af Amer: >90 mL/min (ref >90)
Glucose, Bld: 151 mg/dL — ABNORMAL HIGH (ref 70–99)
Potassium: 3.2 mEq/L — ABNORMAL LOW (ref 3.7–5.3)
SODIUM: 134 meq/L — AB (ref 137–147)
Total Protein: 6.2 g/dL (ref 6.0–8.3)

## 2014-07-14 MED ORDER — POTASSIUM CHLORIDE 20 MEQ/15ML (10%) PO LIQD
40.0000 meq | Freq: Two times a day (BID) | ORAL | Status: DC
  Filled 2014-07-14 (×2): qty 30

## 2014-07-14 MED ORDER — POTASSIUM CHLORIDE 20 MEQ/15ML (10%) PO SOLN
40.0000 meq | Freq: Two times a day (BID) | ORAL | Status: DC
  Administered 2014-07-14 – 2014-07-18 (×8): 40 meq via ORAL
  Filled 2014-07-14 (×11): qty 30

## 2014-07-14 MED ORDER — SODIUM CHLORIDE 0.9 % IV SOLN
INTRAVENOUS | Status: DC
  Administered 2014-07-14 – 2014-07-17 (×5): via INTRAVENOUS
  Filled 2014-07-14 (×9): qty 1000

## 2014-07-14 MED ORDER — POTASSIUM CHLORIDE 20 MEQ/15ML (10%) PO LIQD
40.0000 meq | Freq: Once | ORAL | Status: AC
  Administered 2014-07-14: 40 meq via ORAL
  Filled 2014-07-14: qty 30

## 2014-07-14 MED ORDER — POTASSIUM CHLORIDE IN NACL 20-0.9 MEQ/L-% IV SOLN: INTRAVENOUS | Status: DC

## 2014-07-14 MED ORDER — POTASSIUM CHLORIDE 20 MEQ PO PACK: 40.0000 meq | PACK | Freq: Two times a day (BID) | ORAL | Status: DC

## 2014-07-14 NOTE — Plan of Care (Signed)
Problem: Phase II Progression Outcomes Goal: Vital signs remain stable Outcome: Completed/Met Date Met:  07/14/14 Goal: IV changed to normal saline lock Outcome: Completed/Met Date Met:  07/14/14  Problem: RH BOWEL ELIMINATION Goal: Return of bowel function (flatus, BM) Instruct patient in strategies to promote bowel elimination and management of constipation and .   Outcome: Completed/Met Date Met:  07/14/14     

## 2014-07-14 NOTE — Progress Notes (Signed)
IV infiltration, 2 IV sticks attempted without success.  Anesthesia contact to assess for IV site.  Will continue to monitor.  Osvaldo AngstK. Kaleigha Chamberlin, RN-----------------

## 2014-07-14 NOTE — Progress Notes (Signed)
Hosp day5, day 4 s/p drainage of right TOA by IR placement of pigtail catheter.  Subjective: Patient reports + flatus and + BM.  Had suppository last night , BM this am. Feels stomach is softer, less bloating , minimal po intake yesterday , took Ensure last night kept it down after prior vomiting after regular diet Objective: I have reviewed patient's vital signs, intake and output, medications, labs and microbiology. Culture: abscess: E coli CBC    Component Value Date/Time   WBC 16.1* 07/14/2014 0500   RBC 3.52* 07/14/2014 0500   RBC 4.16 07/18/2013 0048   HGB 9.6* 07/14/2014 0500   HGB 10.6* 06/11/2014 1549   HCT 28.1* 07/14/2014 0500   PLT 371 07/14/2014 0500   MCV 79.8 07/14/2014 0500   MCH 27.3 07/14/2014 0500   MCHC 34.2 07/14/2014 0500   RDW 14.5 07/14/2014 0500   LYMPHSABS 1.8 07/12/2014 1028   MONOABS 1.0 07/12/2014 1028   EOSABS 0.1 07/12/2014 1028   BASOSABS 0.1 07/12/2014 1028    Intake/Output Summary (Last 24 hours) at 07/14/14 0718 Last data filed at 07/14/14 0100  Gross per 24 hour  Intake    360 ml  Output   1891 ml  Net  -1531 ml  Drains 20 cc and 21 cc the last 2 days after much higher drainage the 2 prior days.  General: alert, cooperative and no distress Resp: clear to auscultation bilaterally GI: normal findings: soft abdomen without guarding or rebound and abnormal findings:  distended, hypoactive bowel sounds and but present BS this a.m hypoactive but present in both RUQ and midabdomen, periumbilical, abdomen softer and better BS than yesterday.   Assessment/Plan: 1.  Right TOA, readmission, s/p IR drainage, slow improvement 2.  Hypoactive BS , but no evidence of acute abdomen  Plan:  Continue iv antibiotics an additional 2 more days Keep drains in for now Watch for improved GI function . Recheck cbc, bmet Monday a.m.  LOS: 5 days    Kristin [name] V 07/14/2014, 7:10 AM

## 2014-07-15 ENCOUNTER — Inpatient Hospital Stay (HOSPITAL_COMMUNITY): Payer: 59

## 2014-07-15 ENCOUNTER — Encounter (HOSPITAL_COMMUNITY): Payer: Self-pay | Admitting: *Deleted

## 2014-07-15 LAB — CBC WITH DIFFERENTIAL/PLATELET
Basophils Absolute: 0 10*3/uL (ref 0.0–0.1)
Basophils Relative: 0 % (ref 0–1)
EOS ABS: 0.1 10*3/uL (ref 0.0–0.7)
EOS PCT: 1 % (ref 0–5)
HCT: 27.7 % — ABNORMAL LOW (ref 36.0–46.0)
Hemoglobin: 9.5 g/dL — ABNORMAL LOW (ref 12.0–15.0)
LYMPHS ABS: 1.3 10*3/uL (ref 0.7–4.0)
Lymphocytes Relative: 11 % — ABNORMAL LOW (ref 12–46)
MCH: 27.6 pg (ref 26.0–34.0)
MCHC: 34.3 g/dL (ref 30.0–36.0)
MCV: 80.5 fL (ref 78.0–100.0)
MONO ABS: 0.8 10*3/uL (ref 0.1–1.0)
Monocytes Relative: 7 % (ref 3–12)
Neutro Abs: 9.8 10*3/uL — ABNORMAL HIGH (ref 1.7–7.7)
Neutrophils Relative %: 82 % — ABNORMAL HIGH (ref 43–77)
PLATELETS: 409 10*3/uL — AB (ref 150–400)
RBC: 3.44 MIL/uL — AB (ref 3.87–5.11)
RDW: 14.8 % (ref 11.5–15.5)
WBC: 12 10*3/uL — ABNORMAL HIGH (ref 4.0–10.5)

## 2014-07-15 LAB — ANAEROBIC CULTURE: Special Requests: NORMAL

## 2014-07-15 LAB — BASIC METABOLIC PANEL
Anion gap: 9 (ref 5–15)
BUN: 5 mg/dL — ABNORMAL LOW (ref 6–23)
CALCIUM: 8.5 mg/dL (ref 8.4–10.5)
CO2: 29 mEq/L (ref 19–32)
Chloride: 98 mEq/L (ref 96–112)
Creatinine, Ser: 0.56 mg/dL (ref 0.50–1.10)
GFR calc Af Amer: >90 mL/min (ref >90)
Glucose, Bld: 129 mg/dL — ABNORMAL HIGH (ref 70–99)
Potassium: 4.7 mEq/L (ref 3.7–5.3)
SODIUM: 136 meq/L — AB (ref 137–147)

## 2014-07-15 NOTE — Progress Notes (Signed)
   Pyosalpinx/tubo ovarian abscess  Drains placed 10/28  Drains intact per RN Output still 25-50 cc daily Flushing well Sites clean and dry per RN Wbc 12 afeb Cx +Ecoli  Will follow via phone Can pull drain if output less than 10 cc/day and CT abd shows resolution  If pt goes home with drain Please call [phone] to have scheduler place pt in drain clinic follow up as OP

## 2014-07-15 NOTE — Progress Notes (Signed)
Ur chart review completed.  

## 2014-07-15 NOTE — Plan of Care (Signed)
Problem: Discharge Progression Outcomes Goal: Discharge plan in place and appropriate Outcome: Completed/Met Date Met:  07/15/14 Goal: Pain controlled with appropriate interventions Outcome: Completed/Met Date Met:  07/15/14 Goal: Hemodynamically stable Outcome: Completed/Met Date Met:  07/15/14 Goal: Tolerating diet Outcome: Completed/Met Date Met:  07/15/14 Goal: Activity appropriate for discharge plan Outcome: Completed/Met Date Met:  07/15/14

## 2014-07-15 NOTE — Progress Notes (Addendum)
FACULTY PRACTICE PROGRESS NOTE  Kristin BillowDjeneba Chovan ZOX:[id number] DOB: 01/19/1971 DOA: 07/09/2014 PCP: [name],EDWIN A, MD  Assessment/Plan: 1. TOA 2. Ileus  Culture showed GPC in pairs in chains and E coli, appropriately covered with cefotetan and doxycyline.  WBC appropriately decreased and drains continuing to drain minimally.  Remove drains when less than 15mL.  If goes home with drains, can go to IR clinic - [phone] to arrange.  As abdomen continues to be distended, will get xray to assure resolution of ileus.  If xray okay, could d/c later today.  Code Status: full Disposition Plan: ? D/c later today or tomorrow   Consultants:  Intervention radiology  Procedures:  CT guided drainage of TOA  Antibiotics:  Cefotetan and doxy (indicate start date, and stop date if known)  HPI/Subjective: Patient continues to have abdominal pain, quick satiety.  Abdomen distended.  She reports passing flatus and small amount of stool.  Denies fevers, chills, nausea, vomiting.   Objective: Filed Vitals:   07/15/14 0527  BP: 108/72  Pulse: 80  Temp: 98.4 F (36.9 C)  Resp: 18    Intake/Output Summary (Last 24 hours) at 07/15/14 0746 Last data filed at 07/15/14 0539  Gross per 24 hour  Intake    530 ml  Output   4928 ml  Net  -4398 ml   Filed Weights   07/10/14 0047 07/15/14 0538  Weight: 191 lb 0.4 oz (86.648 kg) 193 lb (87.544 kg)    Exam:   General:  A&O x3, NAD  Cardiovascular: rr, nl s1/s2  Respiratory: CTA  Abdomen: distended, decreased bowel sounds.  Left sided tenderness  Extremity: nontender, no edema.  No calf tenderness.   Data Reviewed: Basic Metabolic Panel:  Recent Labs Lab 07/14/14 1124 07/15/14 0523  NA 134* 136*  K 3.2* 4.7  CL 94* 98  CO2 30 29  GLUCOSE 151* 129*  BUN 4* 5*  CREATININE 0.60 0.56  CALCIUM 8.5 8.5   Liver Function Tests:  Recent Labs Lab 07/14/14 1124  AST 18  ALT 17  ALKPHOS 93  BILITOT 0.6  PROT 6.2  ALBUMIN 2.1*    No results for input(s): LIPASE, AMYLASE in the last 168 hours. No results for input(s): AMMONIA in the last 168 hours. CBC:  Recent Labs Lab 07/09/14 2238 07/12/14 1028 07/14/14 0500 07/15/14 0523  WBC 7.0 20.8* 16.1* 12.0*  NEUTROABS 5.8 17.7*  --  9.8*  HGB 11.8* 11.2* 9.6* 9.5*  HCT 34.4* 32.1* 28.1* 27.7*  MCV 81.3 81.1 79.8 80.5  PLT 264 373 371 409*   Cardiac Enzymes: No results for input(s): CKTOTAL, CKMB, CKMBINDEX, TROPONINI in the last 168 hours. BNP (last 3 results)  Recent Labs  07/17/13 2358  PROBNP 24.7   CBG: No results for input(s): GLUCAP in the last 168 hours.  Recent Results (from the past 240 hour(s))  GC/Chlamydia Probe Amp     Status: None   Collection Time: 07/06/14  9:05 AM  Result Value Ref Range Status   CT Probe RNA NEGATIVE NEGATIVE Final   GC Probe RNA NEGATIVE NEGATIVE Final    Comment: (NOTE)                                                                                       **  Normal Reference Range: Negative**      Assay performed using the Gen-Probe APTIMA COMBO2 (R) Assay. Acceptable specimen types for this assay include APTIMA Swabs (Unisex, endocervical, urethral, or vaginal), first void urine, and ThinPrep liquid based cytology samples. Performed at [facility]  Culture, routine-abscess     Status: None   Collection Time: 07/10/14  4:35 PM  Result Value Ref Range Status   Specimen Description OTHER RIGHT TUBO OVARIAN ABSCESS  Final   Special Requests Normal  Final   Gram Stain   Final    ABUNDANT WBC PRESENT,BOTH PMN AND MONONUCLEAR NO SQUAMOUS EPITHELIAL CELLS SEEN MODERATE GRAM POSITIVE COCCI IN PAIRS IN CHAINS MODERATE GRAM NEGATIVE RODS Performed at [facility]   Culture   Final    MODERATE ESCHERICHIA COLI Performed at [facility]   Report Status 07/12/2014 FINAL  Final   Organism ID, Bacteria ESCHERICHIA COLI  Final      Susceptibility   Escherichia coli - MIC*    AMPICILLIN >=32  RESISTANT Resistant     AMPICILLIN/SULBACTAM >=32 RESISTANT Resistant     CEFAZOLIN <=4 SENSITIVE Sensitive     CEFEPIME <=1 SENSITIVE Sensitive     CEFTAZIDIME <=1 SENSITIVE Sensitive     CEFTRIAXONE <=1 SENSITIVE Sensitive     CIPROFLOXACIN <=0.25 SENSITIVE Sensitive     GENTAMICIN <=1 SENSITIVE Sensitive     IMIPENEM <=0.25 SENSITIVE Sensitive     PIP/TAZO <=4 SENSITIVE Sensitive     TOBRAMYCIN <=1 SENSITIVE Sensitive     TRIMETH/SULFA <=20 SENSITIVE Sensitive     * MODERATE ESCHERICHIA COLI  Anaerobic culture     Status: None (Preliminary result)   Collection Time: 07/10/14  4:35 PM  Result Value Ref Range Status   Specimen Description OTHER RIGHT TUBO OVARIAN ABSCESS  Final   Special Requests Normal  Final   Gram Stain   Final    ABUNDANT WBC PRESENT,BOTH PMN AND MONONUCLEAR NO SQUAMOUS EPITHELIAL CELLS SEEN MODERATE GRAM POSITIVE COCCI IN PAIRS MODERATE GRAM NEGATIVE RODS Performed at [facility]   Culture   Final    NO ANAEROBES ISOLATED; CULTURE IN PROGRESS FOR 5 DAYS Performed at [facility]   Report Status PENDING  Incomplete     Studies: No results found.  Scheduled Meds: . cefoTEtan (CEFOTAN) IV  2 g Intravenous Q12H  . doxycycline (VIBRAMYCIN) IV  100 mg Intravenous Q12H  . feeding supplement (ENSURE COMPLETE)  237 mL Oral QID  . metoCLOPramide (REGLAN) injection  10 mg Intravenous 4 times per day  . pantoprazole  40 mg Oral Daily  . polyethylene glycol  17 g Oral Daily  . potassium chloride  40 mEq Oral BID   Continuous Infusions: . 0.9 % sodium chloride with kcl 75 mL/hr at 07/14/14 1715    Active Problems:   TOA (tubo-ovarian abscess)    Time spent: 25    Kristin CelesteSTINSON, Kristin [name]  Faculty Practice Attending Physician [facility] Phone: [phone] 07/15/2014, 7:46 AM  LOS: 6 days

## 2014-07-16 MED ORDER — HYDROCORTISONE 2.5 % RE CREA
TOPICAL_CREAM | Freq: Two times a day (BID) | RECTAL | Status: DC
  Administered 2014-07-16: 1 via RECTAL
  Administered 2014-07-16 – 2014-07-17 (×2): via RECTAL
  Administered 2014-07-17: 1 via RECTAL
  Administered 2014-07-18: 10:00:00 via RECTAL
  Filled 2014-07-16: qty 28.35

## 2014-07-16 NOTE — Progress Notes (Signed)
Subjective: still pain  But improved.  Patient reports tolerating PO, + flatus and + BM.  Reflux  Objective: I have reviewed patient's vital signs, intake and output, medications, labs and microbiology.  General: alert, cooperative and mild distress GI: soft, not distended, mild tenderness no guarding, clear drainage  Intake/Output Summary (Last 24 hours) at 07/16/14 1013 Last data filed at 07/16/14 0600  Gross per 24 hour  Intake 2170.61 ml  Output 4907.5 ml  Net -2736.89 ml     Assessment/Plan: R TOA, low grade fever, ileus resolving, continue ABX   LOS: 7 days    Kristin [name] 07/16/2014, 10:11 AM

## 2014-07-17 LAB — CBC WITH DIFFERENTIAL/PLATELET
BASOS ABS: 0 10*3/uL (ref 0.0–0.1)
Basophils Relative: 0 % (ref 0–1)
EOS PCT: 1 % (ref 0–5)
Eosinophils Absolute: 0.1 10*3/uL (ref 0.0–0.7)
HCT: 32.3 % — ABNORMAL LOW (ref 36.0–46.0)
Hemoglobin: 11.2 g/dL — ABNORMAL LOW (ref 12.0–15.0)
Lymphocytes Relative: 16 % (ref 12–46)
Lymphs Abs: 1.8 10*3/uL (ref 0.7–4.0)
MCH: 27.7 pg (ref 26.0–34.0)
MCHC: 34.7 g/dL (ref 30.0–36.0)
MCV: 80 fL (ref 78.0–100.0)
MONO ABS: 0.6 10*3/uL (ref 0.1–1.0)
Monocytes Relative: 6 % (ref 3–12)
Neutro Abs: 8.8 10*3/uL — ABNORMAL HIGH (ref 1.7–7.7)
Neutrophils Relative %: 78 % — ABNORMAL HIGH (ref 43–77)
PLATELETS: 610 10*3/uL — AB (ref 150–400)
RBC: 4.04 MIL/uL (ref 3.87–5.11)
RDW: 14.7 % (ref 11.5–15.5)
WBC: 11.3 10*3/uL — ABNORMAL HIGH (ref 4.0–10.5)

## 2014-07-17 NOTE — Progress Notes (Signed)
Subjective:  IV restarted Patient reports tolerating PO and + flatus.  Diarrhea has resolved  Objective: I have reviewed patient's vital signs, intake and output, medications and labs. Blood pressure 121/91, pulse 100, temperature 98.4 F (36.9 C), temperature source Oral, resp. rate 18, height 5\' 5"  (1.651 m), weight 87.544 kg (193 lb), last menstrual period 06/27/2014, SpO2 100 %. Tm 100.5 1000 11/3 General: alert, cooperative and no distress GI: soft, non-tender; bowel sounds normal; no masses,  no organomegaly  Minimal  drainage Assessment/Plan: Improving, last fever was yesterday. If remains afebrile may change to po meds and remove drain  LOS: 8 days    Kristin [name] 07/17/2014, 3:53 PM

## 2014-07-17 NOTE — Progress Notes (Signed)
IV in left forearm discontinued after patient complaints of constant burning and requests to be removed. [facility] 0400 patient crying and continues to report burning, pain and begs to have IV out. MD on call and notified of many IV failures since 10/27 admission and patient's frustration and intolerance. MD states will round on patient and discuss plan in the AM. Will continue to monitor

## 2014-07-18 LAB — CLOSTRIDIUM DIFFICILE BY PCR: CDIFFPCR: NEGATIVE

## 2014-07-18 MED ORDER — METRONIDAZOLE 500 MG PO TABS
500.0000 mg | ORAL_TABLET | Freq: Two times a day (BID) | ORAL | Status: AC
Start: 2014-07-18 — End: 2014-07-25

## 2014-07-18 NOTE — Progress Notes (Signed)
Ambulated out with family 

## 2014-07-18 NOTE — Discharge Summary (Signed)
Physician Discharge Summary  Patient ID: Kristin [name] MRN: [id number] DOB/AGE: 45/09/1970 45 y.o.  Admit date: 07/09/2014 Discharge date: 07/18/2014  Admission Diagnoses: Right TOA  Discharge Diagnoses: same s/p IR drainage Active Problems:   TOA (tubo-ovarian abscess)   Discharged Condition: good  Hospital Course: Patient readmitted for inpatient management of a right TOA and ileus. Patient received IV antibiotics. IR was consulted and 2 drains were placed in the R TOA. After 48 hours of being afebrile, patient was discharged to complete a 14-day course of antibiotics. [facility] with hysterectomy. Discharge instructions were provided  Consults: IR  Treatments: IV antibiotics and IR drainage of pelvic abscess  Discharge Exam: Blood pressure 114/85, pulse 103, temperature 99 F (37.2 C), temperature source Oral, resp. rate 18, height 5\' 5"  (1.651 m), weight 193 lb (87.544 kg), last menstrual period 06/27/2014, SpO2 100 %. General appearance: alert, cooperative and no distress Resp: clear to auscultation bilaterally Cardio: regular rate and rhythm GI: soft, non-tender; bowel sounds normal; no masses,  no organomegaly Extremities: no edema, redness or tenderness in the calves or thighs Incision/Wound:no erythema, induration or drainage Drains put out a combined 32 cc of fluid over 24 hours. Over the past 6 hours- 0 cc in the drains. Drains were removed without difficulty.  Disposition: 01-Home or Self Care     Medication List    TAKE these medications        docusate sodium 100 MG capsule  Commonly known as:  COLACE  Take 1 capsule (100 mg total) by mouth 2 (two) times daily as needed.     doxycycline 100 MG capsule  Commonly known as:  VIBRAMYCIN  Take 1 capsule (100 mg total) by mouth 2 (two) times daily.     ferrous gluconate 324 MG tablet  Commonly known as:  FERGON  1 tablet 2-3 times daily  with orange juice     HYDROcodone-acetaminophen 5-325 MG per tablet  Commonly known as:  NORCO/VICODIN  Take 1-2 tablets by mouth every 4 (four) hours as needed for moderate pain.     ibuprofen 600 MG tablet  Commonly known as:  ADVIL,MOTRIN  Take 1 tablet (600 mg total) by mouth every 6 (six) hours as needed.     metroNIDAZOLE 500 MG tablet  Commonly known as:  FLAGYL  Take 1 tablet (500 mg total) by mouth 2 (two) times daily.         Signed: Nevaen Tredway 07/18/2014, 9:01 AM

## 2014-07-18 NOTE — Progress Notes (Signed)
Teaching complete   work excuse given to pt

## 2014-08-15 ENCOUNTER — Ambulatory Visit (INDEPENDENT_AMBULATORY_CARE_PROVIDER_SITE_OTHER): Payer: Self-pay | Admitting: Obstetrics & Gynecology

## 2014-08-15 VITALS — BP 133/85 | HR 72 | Temp 98.4°F | Ht 65.0 in | Wt 185.7 lb

## 2014-08-15 DIAGNOSIS — N7093 Salpingitis and oophoritis, unspecified: Secondary | ICD-10-CM

## 2014-08-15 NOTE — Progress Notes (Signed)
Pt complains of pain right side of ribs that radiates to upper right abdomen since yesterday.

## 2014-08-16 ENCOUNTER — Encounter: Payer: Self-pay | Admitting: Obstetrics & Gynecology

## 2014-08-16 NOTE — Progress Notes (Signed)
Subjective:     Patient ID: Kristin [name], female   DOB: 10/27/1970, 43 y.o.   MRN: [id number]  HPI Pt weell known to me from prior hospitalization. 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  Pt was s/p IR drainage in Oct and Nov still with subsequent reoccurence.  She reports that he sx have abated at present but, she is quite fearful that the TOA's will reoccur.  Pt denies current n/V.  She also denies fever or chills.  She does report pain in her right side that just started yesterday. She denies h/o trauma.    Past Medical History  Diagnosis Date  . Anemia   . PID (pelvic inflammatory disease)   . Pregnancy induced hypertension   . Gestational diabetes    Past Surgical History  Procedure Laterality Date  . No past surgeries     History   Social History  . Marital Status: Married    Spouse Name: N/A    Number of Children: N/A  . Years of Education: N/A   Occupational History  . Not on file.   Social History Main Topics  . Smoking status: Never Smoker   . Smokeless tobacco: Never Used  . Alcohol Use: No  . Drug Use: No  . Sexual Activity: Yes   Other Topics Concern  . Not on file   Social History Narrative     Review of Systems     Objective:   Physical Exam BP 133/85 mmHg  Pulse 72  Temp(Src) 98.4 F (36.9 C)  Ht 5\' 5"  (1.651 m)  Wt 185 lb 11.2 oz (84.233 kg)  BMI 30.90 kg/m2  LMP 08/13/2014 Pt in NAD Lungs: CTA CV: RRR Abd: obese, NT, ND Right side: tenderness over rib cage.  Very superficial. GU: EGBUS: no lesions Vagina: no blood in vault Cervix: no CMT  Uterus: small, mobile Adnexa: no masses; sl tender   07/09/2014 CLINICAL DATA: Tubo-ovarian abscess.  EXAM: ULTRASOUND PELVIS TRANSVAGINAL  TECHNIQUE: Transvaginal ultrasound examination of the pelvis was performed including evaluation of the uterus, ovaries, adnexal regions, and pelvic cul-de-sac.  COMPARISON: 07/05/2014  FINDINGS: Uterus  Measurements:  12 x 6 x 7 cm. No fibroids or other mass visualized.  Endometrium  Thickness: 7 mm. No focal abnormality visualized.  Right ovary  The right ovary is not visible. A complex tubular or multi septated hypoechoic structure in the right adnexa has more hypoechoic contents, although still complicated by diffuse mid level echoes. The thick wall is hypervascular with suggestion of thickened endosalpingeal folds. The collection has enlarged from previous, now 10 x 6 x 7 cm - 8 x 5 x 4 cm previously.  Left ovary  Not visualized. No adnexal mass.  Other findings: No free fluid  IMPRESSION: Complex right adnexal cystic mass consistent with pyosalpinx or tubo-ovarian abscess. The collection has increased from 07/05/2014.    07/10/2014 CLINICAL DATA: Pyosalpinx. Complex adnexal cystic mass followup.  EXAM: CT PELVIS WITH CONTRAST  TECHNIQUE: Multidetector CT imaging of the pelvis was performed using the standard protocol following the bolus administration of intravenous contrast.  CONTRAST: 100mL OMNIPAQUE IOHEXOL 300 MG/ML SOLN  COMPARISON: Pelvic ultrasound 07/09/2014 and CT abdomen pelvis 10/16/2013.  FINDINGS: A complex low-attenuation collection in the central and right anatomic pelvis appears somewhat tubular in configuration, making accurate measurement difficult. Largest transaxial dimensions are 5.4 x 7.8 cm. Surrounding haziness and stranding. On the prior exam of 10/16/2013, percutaneous drains were seen in the same location. Uterus is  displaced slightly to the left.  Visualized portions of the bowel are grossly unremarkable. Calcification is seen along the bladder wall, as before. Inferior right periaortic lymph node measures 9 mm. No definite dependent pelvic free fluid.  No worrisome lytic or sclerotic lesions.  IMPRESSION: 1. Complex fluid collection in the central anatomic pelvis and right adnexa appears somewhat tubular in  configuration, indicative of a tubo-ovarian abscess. 2. Bladder wall calcification may be post infectious in etiology (e.g. tuberculosis or schistosomiasis).      Assessment:     Recurrent TOA's.  Pt concerned with reoccur ance and requests a pelvic sweep.  This was previously discussed with pt in detail during hosp visit.       Plan:     Patient desires surgical management with total abdominal hysterectomy with bilateral salpingoophorectomy.  The risks of surgery were discussed in detail with the patient including but not limited to: bleeding which may require transfusion or reoperation; infection which may require prolonged hospitalization or re-hospitalization and antibiotic therapy; injury to bowel, bladder, ureters and major vessels or other surrounding organs; need for additional procedures including laparotomy; thromboembolic phenomenon, incisional problems and other postoperative or anesthesia complications.  Patient was told that the likelihood that her condition and symptoms will be treated effectively with this surgical management was very high; the postoperative expectations were also discussed in detail. The patient also understands the alternative treatment options which were discussed in full. All questions were answered.  She was told that she will be contacted by our surgical scheduler regarding the time and date of her surgery; routine preoperative instructions of having nothing to eat or drink after midnight on the day prior to surgery and also coming to the hospital 1 1/2 hours prior to her time of surgery were also emphasized.  She was told she may be called for a preoperative appointment about a week prior to surgery and will be given further preoperative instructions at that visit. Printed patient education handouts about the procedure were given to the patient to review at home.

## 2014-08-16 NOTE — Patient Instructions (Signed)
Abdominal Hysterectomy Abdominal hysterectomy is a surgical procedure to remove your womb (uterus). Your uterus is the muscular organ that contains a developing baby. This surgery is done for many reasons. You may need an abdominal hysterectomy if you have cancer, growths (tumors), long-term pain, or bleeding. You may also have this procedure if your uterus has slipped down into your vagina (uterine prolapse). Depending on why you need an abdominal hysterectomy, you may also have other reproductive organs removed. These could include the part of your vagina that connects with your uterus (cervix), the organs that make eggs (ovaries), and the tubes that connect the ovaries to the uterus (fallopian tubes). LET YOUR HEALTH CARE PROVIDER KNOW ABOUT:   Any allergies you have.  All medicines you are taking, including vitamins, herbs, eye drops, creams, and over-the-counter medicines.  Previous problems you or members of your family have had with the use of anesthetics.  Any blood disorders you have.  Previous surgeries you have had.  Medical conditions you have. RISKS AND COMPLICATIONS Generally, this is a safe procedure. However, as with any procedure, problems can occur. Infection is the most common problem after an abdominal hysterectomy. Other possible problems include:  Bleeding.  Formation of blood clots that may break free and travel to your lungs.  Injury to other organs near your uterus.  Nerve injury causing nerve pain.  Decreased interest in sex or pain during sexual intercourse. BEFORE THE PROCEDURE  Abdominal hysterectomy is a major surgical procedure. It can affect the way you feel about yourself. Talk to your health care provider about the physical and emotional changes hysterectomy may cause.  You may need to have blood work and X-rays done before surgery.  Quit smoking if you smoke. Ask your health care provider for help if you are struggling to quit.  Stop taking  medicines that thin your blood as directed by your health care provider.  You may be instructed to take antibiotic medicines or laxatives before surgery.  Do not eat or drink anything for 6-8 hours before surgery.  Take your regular medicines with a small sip of water.  Bathe or shower the night or morning before surgery. PROCEDURE  Abdominal hysterectomy is done in the operating room at the hospital.  In most cases, you will be given a medicine that makes you go to sleep (general anesthetic).  The surgeon will make a cut (incision) through the skin in your lower belly.  The incision may be about 5-7 inches long. It may go side-to-side or up-and-down.  The surgeon will move aside the body tissue that covers your uterus. The surgeon will then carefully take out your uterus along with any of your other reproductive organs that need to be removed.  Bleeding will be controlled with clamps or sutures.  The surgeon will close your incision with sutures or metal clips. AFTER THE PROCEDURE  You will have some pain immediately after the procedure.  You will be given pain medicine in the recovery room.  You will be taken to your hospital room when you have recovered from the anesthesia.  You may need to stay in the hospital for 2-5 days.  You will be given instructions for recovery at home. Document Released: 09/04/2013 Document Reviewed: 09/04/2013 [employer] Patient Information 2015 [employer]. This information is not intended to replace advice given to you by your health care provider. Make sure you discuss any questions you have with your health care provider.  

## 2014-08-19 ENCOUNTER — Encounter (HOSPITAL_COMMUNITY)
Admission: RE | Admit: 2014-08-19 | Discharge: 2014-08-19 | Disposition: A | Discharge status: Home / Self Care | Payer: No Typology Code available for payment source | Source: Ambulatory Visit | Attending: Obstetrics & Gynecology | Admitting: Obstetrics & Gynecology

## 2014-08-19 ENCOUNTER — Encounter (HOSPITAL_COMMUNITY): Payer: Self-pay | Admitting: *Deleted

## 2014-08-19 ENCOUNTER — Encounter (HOSPITAL_COMMUNITY): Payer: Self-pay

## 2014-08-19 LAB — CBC
HCT: 35.4 % — ABNORMAL LOW (ref 36.0–46.0)
Hemoglobin: 12.1 g/dL (ref 12.0–15.0)
MCH: 27.8 pg (ref 26.0–34.0)
MCHC: 34.2 g/dL (ref 30.0–36.0)
MCV: 81.2 fL (ref 78.0–100.0)
Platelets: 313 10*3/uL (ref 150–400)
RBC: 4.36 MIL/uL (ref 3.87–5.11)
RDW: 18.3 % — AB (ref 11.5–15.5)
WBC: 6.1 10*3/uL (ref 4.0–10.5)

## 2014-08-19 LAB — TYPE AND SCREEN
ABO/RH(D): A POS
ANTIBODY SCREEN: NEGATIVE

## 2014-08-19 LAB — ABO/RH: ABO/RH(D): A POS

## 2014-08-19 NOTE — Patient Instructions (Addendum)
Your procedure is scheduled on:08/20/14  Enter through the Main Entrance at : 6am Pick up desk phone and dial [phone] and inform us of your arrival.  Please call [phone] if you have any problems the morning of surgery.  Remember: Do not eat food or drink liquids, including water, after midnight:tonight    You may brush your teeth the morning of surgery.  DO NOT wear jewelry, eye make-up, lipstick,body lotion, or dark fingernail polish.  (Polished toes are ok) You may wear deodorant.  If you are to be admitted after surgery, leave suitcase in car until your room has been assigned. Patients discharged on the day of surgery will not be allowed to drive home. Wear loose fitting, comfortable clothes for your ride home.

## 2014-08-20 ENCOUNTER — Inpatient Hospital Stay (HOSPITAL_COMMUNITY)
Admission: RE | Admit: 2014-08-20 | Discharge: 2014-08-22 | DRG: 743 | Disposition: A | Discharge status: Home / Self Care | Payer: No Typology Code available for payment source | Source: Ambulatory Visit | Attending: Obstetrics & Gynecology | Admitting: Obstetrics & Gynecology

## 2014-08-20 ENCOUNTER — Encounter (HOSPITAL_COMMUNITY)
Admission: RE | Disposition: A | Discharge status: Home / Self Care | Payer: Self-pay | Source: Ambulatory Visit | Attending: Obstetrics & Gynecology

## 2014-08-20 ENCOUNTER — Inpatient Hospital Stay (HOSPITAL_COMMUNITY): Payer: No Typology Code available for payment source | Admitting: Anesthesiology

## 2014-08-20 ENCOUNTER — Encounter (HOSPITAL_COMMUNITY): Payer: Self-pay | Admitting: Anesthesiology

## 2014-08-20 ENCOUNTER — Encounter: Payer: Self-pay | Admitting: Obstetrics & Gynecology

## 2014-08-20 DIAGNOSIS — N730 Acute parametritis and pelvic cellulitis: Secondary | ICD-10-CM | POA: Diagnosis not present

## 2014-08-20 DIAGNOSIS — N858 Other specified noninflammatory disorders of uterus: Secondary | ICD-10-CM | POA: Diagnosis present

## 2014-08-20 DIAGNOSIS — N7013 Chronic salpingitis and oophoritis: Secondary | ICD-10-CM

## 2014-08-20 DIAGNOSIS — N7011 Chronic salpingitis: Secondary | ICD-10-CM

## 2014-08-20 DIAGNOSIS — N7093 Salpingitis and oophoritis, unspecified: Principal | ICD-10-CM

## 2014-08-20 DIAGNOSIS — Z9889 Other specified postprocedural states: Secondary | ICD-10-CM

## 2014-08-20 HISTORY — PX: ABDOMINAL HYSTERECTOMY: SHX81

## 2014-08-20 LAB — PREGNANCY, URINE: Preg Test, Ur: NEGATIVE

## 2014-08-20 SURGERY — HYSTERECTOMY, ABDOMINAL
Anesthesia: General | Site: Abdomen

## 2014-08-20 MED ORDER — KETOROLAC TROMETHAMINE 30 MG/ML IJ SOLN
30.0000 mg | Freq: Four times a day (QID) | INTRAMUSCULAR | Status: AC
  Administered 2014-08-20 – 2014-08-21 (×3): 30 mg via INTRAVENOUS
  Filled 2014-08-20 (×3): qty 1

## 2014-08-20 MED ORDER — PROMETHAZINE HCL 25 MG/ML IJ SOLN: 6.2500 mg | INTRAMUSCULAR | Status: DC | PRN

## 2014-08-20 MED ORDER — MIDAZOLAM HCL 2 MG/2ML IJ SOLN
INTRAMUSCULAR | Status: AC
  Filled 2014-08-20: qty 2

## 2014-08-20 MED ORDER — NALOXONE HCL 0.4 MG/ML IJ SOLN: 0.4000 mg | INTRAMUSCULAR | Status: DC | PRN

## 2014-08-20 MED ORDER — LIDOCAINE HCL (CARDIAC) 20 MG/ML IV SOLN
INTRAVENOUS | Status: AC
  Filled 2014-08-20: qty 5

## 2014-08-20 MED ORDER — SODIUM CHLORIDE 0.9 % IJ SOLN
INTRAMUSCULAR | Status: AC
  Filled 2014-08-20: qty 20

## 2014-08-20 MED ORDER — ONDANSETRON HCL 4 MG PO TABS: 4.0000 mg | ORAL_TABLET | Freq: Four times a day (QID) | ORAL | Status: DC | PRN

## 2014-08-20 MED ORDER — DOCUSATE SODIUM 100 MG PO CAPS
100.0000 mg | ORAL_CAPSULE | Freq: Two times a day (BID) | ORAL | Status: DC
  Administered 2014-08-20 – 2014-08-22 (×4): 100 mg via ORAL
  Filled 2014-08-20 (×4): qty 1

## 2014-08-20 MED ORDER — ESTROGENS, CONJUGATED 0.625 MG/GM VA CREA
TOPICAL_CREAM | VAGINAL | Status: AC
  Filled 2014-08-20: qty 42.5

## 2014-08-20 MED ORDER — KETOROLAC TROMETHAMINE 30 MG/ML IJ SOLN: 30.0000 mg | Freq: Four times a day (QID) | INTRAMUSCULAR | Status: AC

## 2014-08-20 MED ORDER — SIMETHICONE 80 MG PO CHEW: 80.0000 mg | CHEWABLE_TABLET | Freq: Four times a day (QID) | ORAL | Status: DC | PRN

## 2014-08-20 MED ORDER — MIDAZOLAM HCL 2 MG/2ML IJ SOLN
INTRAMUSCULAR | Status: DC | PRN
  Administered 2014-08-20: 0.5 mg via INTRAVENOUS
  Administered 2014-08-20: 1.5 mg via INTRAVENOUS

## 2014-08-20 MED ORDER — MEPERIDINE HCL 25 MG/ML IJ SOLN: 6.2500 mg | INTRAMUSCULAR | Status: DC | PRN

## 2014-08-20 MED ORDER — GLYCOPYRROLATE 0.2 MG/ML IJ SOLN
INTRAMUSCULAR | Status: AC
  Filled 2014-08-20: qty 3

## 2014-08-20 MED ORDER — 0.9 % SODIUM CHLORIDE (POUR BTL) OPTIME
TOPICAL | Status: DC | PRN
  Administered 2014-08-20 (×3): 1000 mL

## 2014-08-20 MED ORDER — BUPIVACAINE LIPOSOME 1.3 % IJ SUSP
INTRAMUSCULAR | Status: DC | PRN
  Administered 2014-08-20: 20 mL

## 2014-08-20 MED ORDER — LIDOCAINE HCL (CARDIAC) 20 MG/ML IV SOLN
INTRAVENOUS | Status: DC | PRN
  Administered 2014-08-20: 60 mg via INTRAVENOUS

## 2014-08-20 MED ORDER — NEOSTIGMINE METHYLSULFATE 10 MG/10ML IV SOLN
INTRAVENOUS | Status: DC | PRN
  Administered 2014-08-20: 4 mg via INTRAVENOUS

## 2014-08-20 MED ORDER — MENTHOL 3 MG MT LOZG: 1.0000 | LOZENGE | OROMUCOSAL | Status: DC | PRN

## 2014-08-20 MED ORDER — DEXTROSE-NACL 5-0.45 % IV SOLN
INTRAVENOUS | Status: DC
  Administered 2014-08-21: 09:00:00 via INTRAVENOUS

## 2014-08-20 MED ORDER — BUPIVACAINE HCL (PF) 0.5 % IJ SOLN
INTRAMUSCULAR | Status: AC
  Filled 2014-08-20: qty 30

## 2014-08-20 MED ORDER — PROPOFOL 10 MG/ML IV BOLUS
INTRAVENOUS | Status: DC | PRN
  Administered 2014-08-20: 30 mg via INTRAVENOUS
  Administered 2014-08-20: 150 mg via INTRAVENOUS

## 2014-08-20 MED ORDER — KETOROLAC TROMETHAMINE 30 MG/ML IJ SOLN
INTRAMUSCULAR | Status: DC | PRN
  Administered 2014-08-20: 30 mg via INTRAVENOUS

## 2014-08-20 MED ORDER — FENTANYL CITRATE 0.05 MG/ML IJ SOLN
INTRAMUSCULAR | Status: DC | PRN
  Administered 2014-08-20 (×2): 100 ug via INTRAVENOUS
  Administered 2014-08-20: 50 ug via INTRAVENOUS
  Administered 2014-08-20 (×2): 100 ug via INTRAVENOUS
  Administered 2014-08-20: 50 ug via INTRAVENOUS

## 2014-08-20 MED ORDER — DIPHENHYDRAMINE HCL 50 MG/ML IJ SOLN: 12.5000 mg | Freq: Four times a day (QID) | INTRAMUSCULAR | Status: DC | PRN

## 2014-08-20 MED ORDER — LACTATED RINGERS IV SOLN
INTRAVENOUS | Status: DC
  Administered 2014-08-20 (×3): via INTRAVENOUS

## 2014-08-20 MED ORDER — FENTANYL CITRATE 0.05 MG/ML IJ SOLN
INTRAMUSCULAR | Status: AC
  Filled 2014-08-20: qty 5

## 2014-08-20 MED ORDER — IBUPROFEN 800 MG PO TABS
800.0000 mg | ORAL_TABLET | Freq: Three times a day (TID) | ORAL | Status: DC | PRN
  Administered 2014-08-21 – 2014-08-22 (×3): 800 mg via ORAL
  Filled 2014-08-20 (×3): qty 1

## 2014-08-20 MED ORDER — ONDANSETRON HCL 4 MG/2ML IJ SOLN: 4.0000 mg | Freq: Four times a day (QID) | INTRAMUSCULAR | Status: DC | PRN

## 2014-08-20 MED ORDER — ONDANSETRON HCL 4 MG/2ML IJ SOLN
INTRAMUSCULAR | Status: DC | PRN
  Administered 2014-08-20 (×2): 2 mg via INTRAVENOUS

## 2014-08-20 MED ORDER — LABETALOL HCL 5 MG/ML IV SOLN
INTRAVENOUS | Status: DC | PRN
  Administered 2014-08-20: 10 mg via INTRAVENOUS

## 2014-08-20 MED ORDER — ONDANSETRON HCL 4 MG/2ML IJ SOLN
4.0000 mg | Freq: Four times a day (QID) | INTRAMUSCULAR | Status: DC | PRN
Start: 2014-08-20 — End: 2014-08-22

## 2014-08-20 MED ORDER — BUPIVACAINE LIPOSOME 1.3 % IJ SUSP
20.0000 mL | Freq: Once | INTRAMUSCULAR | Status: DC
  Filled 2014-08-20: qty 20

## 2014-08-20 MED ORDER — NEOSTIGMINE METHYLSULFATE 10 MG/10ML IV SOLN
INTRAVENOUS | Status: AC
  Filled 2014-08-20: qty 1

## 2014-08-20 MED ORDER — HYDROMORPHONE HCL 1 MG/ML IJ SOLN
INTRAMUSCULAR | Status: AC
  Filled 2014-08-20: qty 1

## 2014-08-20 MED ORDER — PROPOFOL 10 MG/ML IV EMUL
INTRAVENOUS | Status: AC
  Filled 2014-08-20: qty 20

## 2014-08-20 MED ORDER — KETOROLAC TROMETHAMINE 30 MG/ML IJ SOLN
INTRAMUSCULAR | Status: AC
  Filled 2014-08-20: qty 1

## 2014-08-20 MED ORDER — LACTATED RINGERS IV SOLN: INTRAVENOUS | Status: DC

## 2014-08-20 MED ORDER — HYDROMORPHONE HCL 1 MG/ML IJ SOLN
0.2500 mg | INTRAMUSCULAR | Status: DC | PRN
  Administered 2014-08-20 (×4): 0.5 mg via INTRAVENOUS

## 2014-08-20 MED ORDER — PANTOPRAZOLE SODIUM 40 MG PO TBEC
40.0000 mg | DELAYED_RELEASE_TABLET | Freq: Every day | ORAL | Status: DC
  Administered 2014-08-21 – 2014-08-22 (×2): 40 mg via ORAL
  Filled 2014-08-20 (×2): qty 1

## 2014-08-20 MED ORDER — KETOROLAC TROMETHAMINE 30 MG/ML IJ SOLN: 15.0000 mg | Freq: Once | INTRAMUSCULAR | Status: DC | PRN

## 2014-08-20 MED ORDER — GENTAMICIN SULFATE 40 MG/ML IJ SOLN
INTRAVENOUS | Status: AC
  Administered 2014-08-20: 116.5 mL via INTRAVENOUS
  Filled 2014-08-20: qty 10.5

## 2014-08-20 MED ORDER — ROCURONIUM BROMIDE 100 MG/10ML IV SOLN
INTRAVENOUS | Status: DC | PRN
  Administered 2014-08-20: 50 mg via INTRAVENOUS

## 2014-08-20 MED ORDER — GLYCOPYRROLATE 0.2 MG/ML IJ SOLN
INTRAMUSCULAR | Status: DC | PRN
  Administered 2014-08-20: 0.4 mg via INTRAVENOUS
  Administered 2014-08-20 (×2): 0.1 mg via INTRAVENOUS

## 2014-08-20 MED ORDER — DIPHENHYDRAMINE HCL 12.5 MG/5ML PO ELIX: 12.5000 mg | ORAL_SOLUTION | Freq: Four times a day (QID) | ORAL | Status: DC | PRN

## 2014-08-20 MED ORDER — DEXAMETHASONE SODIUM PHOSPHATE 10 MG/ML IJ SOLN
INTRAMUSCULAR | Status: DC | PRN
  Administered 2014-08-20: 10 mg via INTRAVENOUS

## 2014-08-20 MED ORDER — SCOPOLAMINE 1 MG/3DAYS TD PT72
MEDICATED_PATCH | TRANSDERMAL | Status: AC
  Administered 2014-08-20: 1.5 mg via TRANSDERMAL
  Filled 2014-08-20: qty 1

## 2014-08-20 MED ORDER — SCOPOLAMINE 1 MG/3DAYS TD PT72
1.0000 | MEDICATED_PATCH | Freq: Once | TRANSDERMAL | Status: AC
Start: 2014-08-20 — End: 2014-08-20
  Administered 2014-08-20: 1.5 mg via TRANSDERMAL
  Administered 2014-08-20: 1 via TRANSDERMAL

## 2014-08-20 MED ORDER — SODIUM CHLORIDE 0.9 % IJ SOLN: 9.0000 mL | INTRAMUSCULAR | Status: DC | PRN

## 2014-08-20 MED ORDER — DEXAMETHASONE SODIUM PHOSPHATE 10 MG/ML IJ SOLN
INTRAMUSCULAR | Status: AC
  Filled 2014-08-20: qty 1

## 2014-08-20 MED ORDER — ROCURONIUM BROMIDE 100 MG/10ML IV SOLN
INTRAVENOUS | Status: AC
  Filled 2014-08-20: qty 1

## 2014-08-20 MED ORDER — SODIUM CHLORIDE 0.9 % IJ SOLN
INTRAMUSCULAR | Status: DC | PRN
  Administered 2014-08-20: 20 mL

## 2014-08-20 MED ORDER — MORPHINE SULFATE (PF) 1 MG/ML IV SOLN
INTRAVENOUS | Status: DC
  Administered 2014-08-20: 1 mg via INTRAVENOUS
  Administered 2014-08-20: 13:00:00 via INTRAVENOUS
  Administered 2014-08-20: 3 mg via INTRAVENOUS
  Administered 2014-08-21: 1 mg via INTRAVENOUS
  Filled 2014-08-20: qty 25

## 2014-08-20 MED ORDER — DOXYCYCLINE HYCLATE 100 MG IV SOLR
100.0000 mg | Freq: Two times a day (BID) | INTRAVENOUS | Status: DC
  Administered 2014-08-20 – 2014-08-21 (×2): 100 mg via INTRAVENOUS
  Filled 2014-08-20 (×3): qty 100

## 2014-08-20 MED ORDER — OXYCODONE-ACETAMINOPHEN 5-325 MG PO TABS
1.0000 | ORAL_TABLET | ORAL | Status: DC | PRN
  Administered 2014-08-21 – 2014-08-22 (×5): 1 via ORAL
  Filled 2014-08-20 (×5): qty 1

## 2014-08-20 MED ORDER — LABETALOL HCL 5 MG/ML IV SOLN
INTRAVENOUS | Status: AC
  Filled 2014-08-20: qty 4

## 2014-08-20 MED ORDER — ONDANSETRON HCL 4 MG/2ML IJ SOLN
INTRAMUSCULAR | Status: AC
  Filled 2014-08-20: qty 2

## 2014-08-20 SURGICAL SUPPLY — 44 items
APL SKNCLS STERI-STRIP NONHPOA (GAUZE/BANDAGES/DRESSINGS) ×1
BENZOIN TINCTURE PRP APPL 2/3 (GAUZE/BANDAGES/DRESSINGS) ×2 IMPLANT
BINDER ABD UNIV 12 45-62 (WOUND CARE) IMPLANT
BINDER ABDOMINAL 46IN 62IN (WOUND CARE) ×3
CANISTER SUCT 3000ML (MISCELLANEOUS) ×5 IMPLANT
CLOSURE WOUND 1/2 X4 (GAUZE/BANDAGES/DRESSINGS) ×1
CLOTH BEACON ORANGE TIMEOUT ST (SAFETY) ×3 IMPLANT
CONT PATH 16OZ SNAP LID 3702 (MISCELLANEOUS) ×3 IMPLANT
DECANTER SPIKE VIAL GLASS SM (MISCELLANEOUS) ×2 IMPLANT
DRAPE WARM FLUID 44X44 (DRAPE) ×2 IMPLANT
DRSG OPSITE POSTOP 4X10 (GAUZE/BANDAGES/DRESSINGS) ×3 IMPLANT
DURAPREP 26ML APPLICATOR (WOUND CARE) ×3 IMPLANT
GAUZE SPONGE 4X4 12PLY STRL (GAUZE/BANDAGES/DRESSINGS) ×3 IMPLANT
GAUZE SPONGE 4X4 16PLY XRAY LF (GAUZE/BANDAGES/DRESSINGS) ×3 IMPLANT
GLOVE BIO SURGEON STRL SZ7 (GLOVE) ×3 IMPLANT
GLOVE BIOGEL PI IND STRL 7.0 (GLOVE) ×3 IMPLANT
GLOVE BIOGEL PI INDICATOR 7.0 (GLOVE) ×10
GOWN STRL REUS W/TWL LRG LVL3 (GOWN DISPOSABLE) ×8 IMPLANT
NEEDLE HYPO 22GX1.5 SAFETY (NEEDLE) ×3 IMPLANT
NS IRRIG 1000ML POUR BTL (IV SOLUTION) ×5 IMPLANT
PACK ABDOMINAL GYN (CUSTOM PROCEDURE TRAY) ×3 IMPLANT
PAD ABD 8X7 1/2 STERILE (GAUZE/BANDAGES/DRESSINGS) ×2 IMPLANT
PAD OB MATERNITY 4.3X12.25 (PERSONAL CARE ITEMS) ×3 IMPLANT
PROTECTOR NERVE ULNAR (MISCELLANEOUS) ×3 IMPLANT
SPONGE GAUZE 4X4 12PLY STER LF (GAUZE/BANDAGES/DRESSINGS) ×2 IMPLANT
SPONGE LAP 18X18 X RAY DECT (DISPOSABLE) ×6 IMPLANT
STAPLER VISISTAT 35W (STAPLE) ×3 IMPLANT
STRIP CLOSURE SKIN 1/2X4 (GAUZE/BANDAGES/DRESSINGS) ×1 IMPLANT
SURGIFLO W/THROMBIN 8M KIT (HEMOSTASIS) ×2 IMPLANT
SUT VIC AB 0 CT1 18XCR BRD8 (SUTURE) ×3 IMPLANT
SUT VIC AB 0 CT1 27 (SUTURE) ×6
SUT VIC AB 0 CT1 27XBRD ANBCTR (SUTURE) ×2 IMPLANT
SUT VIC AB 0 CT1 8-18 (SUTURE) ×9
SUT VIC AB 3-0 CT1 27 (SUTURE) ×6
SUT VIC AB 3-0 CT1 TAPERPNT 27 (SUTURE) ×1 IMPLANT
SUT VIC AB 3-0 SH 27 (SUTURE) ×3
SUT VIC AB 3-0 SH 27X BRD (SUTURE) IMPLANT
SUT VIC AB 4-0 KS 27 (SUTURE) ×2 IMPLANT
SUT VICRYL 0 TIES 12 18 (SUTURE) ×3 IMPLANT
SYR CONTROL 10ML LL (SYRINGE) ×3 IMPLANT
TAPE CLOTH SURG 4X10 WHT LF (GAUZE/BANDAGES/DRESSINGS) ×2 IMPLANT
TOWEL OR 17X24 6PK STRL BLUE (TOWEL DISPOSABLE) ×6 IMPLANT
TRAY FOLEY CATH 14FR (SET/KITS/TRAYS/PACK) ×3 IMPLANT
WATER STERILE IRR 1000ML POUR (IV SOLUTION) ×3 IMPLANT

## 2014-08-20 NOTE — Op Note (Signed)
PATIENT: Kristin [name] 43 y.o. female  PRE-OPERATIVE DIAGNOSIS: Right Adnexal Mass; recurrent TOA's  POST-OPERATIVE DIAGNOSIS: Right Adnexal Mass; recurrent TOA's  PROCEDURE: Procedure(s): HYSTERECTOMY ABDOMINAL, Bilateral Salpingo oophrectomy, Possible Lysis of Adhesions (N/A)  SURGEON: Surgeon(s) and Role:  * Willodean Rosenthalarolyn Harraway-Smith, MD - Primary  * Tereso NewcomerUgonna A Anyanwu, MD - Assisting  ANESTHESIA: general  EBL: Total I/O In: 1000 [I.V.:1000] Out: 400 [Urine:200; Blood:200]  BLOOD ADMINISTERED:none  DRAINS: none   LOCAL MEDICATIONS USED: OTHER Exparel  SPECIMEN: Source of Specimen: uterus, bilateral ovareis and bilateral fallopian tubes  DISPOSITION OF SPECIMEN: PATHOLOGY  COUNTS: YES  TOURNIQUET: * No tourniquets in log *  DICTATION: .Note written in EPIC  PLAN OF CARE: Admit to inpatient   PATIENT DISPOSITION: PACU - hemodynamically stable.  Delay start of Pharmacological VTE agent (>24hrs) due to surgical blood loss or risk of bleeding: yes  Complications: none immediate  INDICATIONS: The patient is a 43 y.o. G5P5 with the aforementioned diagnoses who desires definitive surgical management. On the preoperative visit, the risks, benefits, indications, and alternatives of the procedure were reviewed with the patient.  On the day of surgery, the risks of surgery were again discussed with the patient including but not limited to: bleeding which may require transfusion or reoperation; infection which may require antibiotics; injury to bowel, bladder, ureters or other surrounding organs; need for additional procedures; thromboembolic phenomenon, incisional problems and other postoperative/anesthesia complications. Written informed consent was obtained.    OPERATIVE FINDINGS: A 10 week size uterus with persistent TOA on the right side associated with induration; dilated fallopian tubes bilaterally.    DESCRIPTION OF PROCEDURE:  The patient received  intravenous antibiotics and had sequential compression devices applied to her lower extremities while in the preoperative area.   She was taken to the operating room and placed under general anesthesia without difficulty.The abdomen and perineum were prepped and draped in a sterile manner, and she was placed in a dorsal supine position.  A Foley catheter was inserted into the bladder and attached to constant drainage. After an adequate timeout was performed, a Pfannensteil skin incision was made. This incision was taken down to the fascia using electrocautery with care given to maintain good hemostasis. The fascia was incised in the midline and the fascial incision was then extended bilaterally using electrocautery without difficulty. The fascia was then dissected off the underlying rectus muscles using blunt and sharp dissection. The rectus muscles were split bluntly in the midline and the peritoneum entered sharply without complication. This peritoneal incision was then extended superiorly and inferiorly with care given to prevent bowel or bladder injury. Attention was then turned to the pelvis. There were adhesions of bowel to the uterus which were resected using sharp and blunt dissection.  A retractor was placed into the incision, and the bowel was packed away with moist laparotomy sponges. The uterus at this point was noted to be mobilized and was delivered up out of the abdomen.  The bowel was packed away with moist laparotomy sponges. The round ligaments on each side were clamped, suture ligated with 0 Vicryl, and transected with electrocautery allowing entry into the broad ligament. Of note, all sutures used in this procedure are 0 Vicryl unless otherwise noted. The anterior and posterior leaves of the broad ligament were separated, and the ureters were inspected to be safely away from the area of dissection bilaterally.  The patient's infundibulopelvic ligaments were clamped bilaterally, cut, and doubly  suture ligated. This procedure was repeated in an identical  fashion on the left site allowing for removal of both adnexa. Kelly clamps were placed on the mesosalpinx of the right fallopian tube, and the fallopian tube was excised.    A bladder flap was then created.  The bladder was then bluntly dissected off the lower uterine segment and cervix with good hemostasis noted. The uterine arteries were then skeletonized bilaterally and then clamped, cut, and doubly suture ligated with care given to prevent ureteral injury.  The uterosacral ligaments were then clamped, cut, and ligated bilaterally.  Finally, the cardinal ligaments were clamped, cut, and ligated bilaterally.  Acutely curved clamps were placed across the vagina just under the cervix, and the specimen was amputated and sent to pathology. The vaginal cuff angles were closed with Heaney stiches with care given to incorporate the uterosacral-cardinal ligament pedicles on both sides. The middle of the vaginal cuff was closed with a series of interrupted figure-of-eight sutures with care given to incorporate the anterior pubocervical fascia and the posterior rectovaginal fascia.  There was an area of the sigmoid colon which appeared to be denuded.  Surgiflow was placed in the pelvis to aid in further hemostasis.  A suture of 3-0 vicryl was used to oversew this area. The pelvis was copiously  irrigated and hemostasis was reconfirmed at all pedicles and along the pelvic sidewall.   All laparotomy sponges and instruments were removed from the abdomen. The peritoneum was closed with a running stitch, and the fascia was also closed in a running fashion. Exparel 40cc of 20cc Exparel with 20cc NS was injected into the subcutaneous fascia. The skin was closed with a 4-0 Vicryl subcuticular stitch. Sponge, lap, needle, and instrument counts were correct times two. As pt was being transferred it was noted that she had an increased amount of bleeding vaginally.  Upon  evaluation, there was bleeding from the cuff in one spot.  A figure of eight suture was placed and hemostasis was obtained.  The patient was taken to the recovery area awake, extubated and in stable condition.    Powell Halbert L. Harraway-Smith, M.D., [facility] 08/20/2014 9:30 AM

## 2014-08-20 NOTE — H&P (Signed)
Patient ID: Kristin [name], female DOB: 11/16/1970, 43 y.o. MRN: [id number]  HPI Pt weell known to me from prior hospitalization. 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 Pt was s/p IR drainage in Oct and Nov still with subsequent reoccurence. She reports that he sx have abated at present but, she is quite fearful that the TOA's will reoccur. Pt denies current n/V. She also denies fever or chills. She does report pain in her right side that just started yesterday. She denies h/o trauma.   Past Medical History  Diagnosis Date  . Anemia   . PID (pelvic inflammatory disease)   . Pregnancy induced hypertension   . Gestational diabetes    Past Surgical History  Procedure Laterality Date  . No past surgeries     History   Social History  . Marital Status: Married    Spouse Name: N/A    Number of Children: N/A  . Years of Education: N/A   Occupational History  . Not on file.   Social History Main Topics  . Smoking status: Never Smoker   . Smokeless tobacco: Never Used  . Alcohol Use: No  . Drug Use: No  . Sexual Activity: Yes   Other Topics Concern  . Not on file   Social History Narrative     Review of Systems     Objective:   Physical Exam BP 133/85 mmHg  Pulse 72  Temp(Src) 98.4 F (36.9 C)  Ht 5\' 5"  (1.651 m)  Wt 185 lb 11.2 oz (84.233 kg)  BMI 30.90 kg/m2  LMP 08/13/2014 Pt in NAD Lungs: CTA CV: RRR Abd: obese, NT, ND Right side: tenderness over rib cage. Very superficial. GU: EGBUS: no lesions Vagina: no blood in vault Cervix: no CMT  Uterus: small, mobile Adnexa: no masses; sl tender   07/09/2014 CLINICAL DATA: Tubo-ovarian abscess.  EXAM: ULTRASOUND PELVIS TRANSVAGINAL  TECHNIQUE: Transvaginal ultrasound examination of the pelvis was performed including evaluation of the uterus, ovaries, adnexal regions, and pelvic  cul-de-sac.  COMPARISON: 07/05/2014  FINDINGS: Uterus  Measurements: 12 x 6 x 7 cm. No fibroids or other mass visualized.  Endometrium  Thickness: 7 mm. No focal abnormality visualized.  Right ovary  The right ovary is not visible. A complex tubular or multi septated hypoechoic structure in the right adnexa has more hypoechoic contents, although still complicated by diffuse mid level echoes. The thick wall is hypervascular with suggestion of thickened endosalpingeal folds. The collection has enlarged from previous, now 10 x 6 x 7 cm - 8 x 5 x 4 cm previously.  Left ovary  Not visualized. No adnexal mass.  Other findings: No free fluid  IMPRESSION: Complex right adnexal cystic mass consistent with pyosalpinx or tubo-ovarian abscess. The collection has increased from 07/05/2014.   07/10/2014 CLINICAL DATA: Pyosalpinx. Complex adnexal cystic mass followup.  EXAM: CT PELVIS WITH CONTRAST  TECHNIQUE: Multidetector CT imaging of the pelvis was performed using the standard protocol following the bolus administration of intravenous contrast.  CONTRAST: 100mL OMNIPAQUE IOHEXOL 300 MG/ML SOLN  COMPARISON: Pelvic ultrasound 07/09/2014 and CT abdomen pelvis 10/16/2013.  FINDINGS: A complex low-attenuation collection in the central and right anatomic pelvis appears somewhat tubular in configuration, making accurate measurement difficult. Largest transaxial dimensions are 5.4 x 7.8 cm. Surrounding haziness and stranding. On the prior exam of 10/16/2013, percutaneous drains were seen in the same location. Uterus is displaced slightly to the left.  Visualized portions of the bowel are grossly unremarkable. Calcification is seen  along the bladder wall, as before. Inferior right periaortic lymph node measures 9 mm. No definite dependent pelvic free fluid.  No worrisome lytic or sclerotic lesions.  IMPRESSION: 1. Complex fluid collection in  the central anatomic pelvis and right adnexa appears somewhat tubular in configuration, indicative of a tubo-ovarian abscess. 2. Bladder wall calcification may be post infectious in etiology (e.g. tuberculosis or schistosomiasis).      Assessment:     Recurrent TOA's. Pt concerned with reoccur ance and requests a pelvic sweep. This was previously discussed with pt in detail during hosp visit.     Plan:     Patient desires surgical management with total abdominal hysterectomy with bilateral salpingoophorectomy. The risks of surgery were discussed in detail with the patient including but not limited to: bleeding which may require transfusion or reoperation; infection which may require prolonged hospitalization or re-hospitalization and antibiotic therapy; injury to bowel, bladder, ureters and major vessels or other surrounding organs; need for additional procedures including laparotomy; thromboembolic phenomenon, incisional problems and other postoperative or anesthesia complications. Patient was told that the likelihood that her condition and symptoms will be treated effectively with this surgical management was very high; the postoperative expectations were also discussed in detail. The patient also understands the alternative treatment options which were discussed in full. All questions were answered

## 2014-08-20 NOTE — Brief Op Note (Signed)
08/20/2014  9:28 AM  PATIENT:  Kristin [name]  43 y.o. female  PRE-OPERATIVE DIAGNOSIS:  Right Adnexal Mass  POST-OPERATIVE DIAGNOSIS:  Right Adnexal Mass  PROCEDURE:  Procedure(s): HYSTERECTOMY ABDOMINAL, Bilateral Salpingo oophrectomy, Possible Lysis of Adhesions (N/A)  SURGEON:  Surgeon(s) and Role:    * Willodean Rosenthalarolyn Harraway-Smith, MD - Primary    * Tereso NewcomerUgonna A Anyanwu, MD - Assisting  ANESTHESIA:   general  EBL:  Total I/O In: 1000 [I.V.:1000] Out: 400 [Urine:200; Blood:200]  BLOOD ADMINISTERED:none  DRAINS: none   LOCAL MEDICATIONS USED:  OTHER Exparel  SPECIMEN:  Source of Specimen:  uterus, bilateral ovareis and bilateral fallopian tubes  DISPOSITION OF SPECIMEN:  PATHOLOGY  COUNTS:  YES  TOURNIQUET:  * No tourniquets in log *  DICTATION: .Note written in EPIC  PLAN OF CARE: Admit to inpatient   PATIENT DISPOSITION:  PACU - hemodynamically stable.   Delay start of Pharmacological VTE agent (>24hrs) due to surgical blood loss or risk of bleeding: yes

## 2014-08-20 NOTE — Transfer of Care (Signed)
Immediate Anesthesia Transfer of Care Note  Patient: Kristin [name]  Procedure(s) Performed: Procedure(s): HYSTERECTOMY ABDOMINAL, Bilateral Salpingo oophrectomy, Hemostasis of Vaginal Cuff (N/A)  Patient Location: PACU  Anesthesia Type:General  Level of Consciousness: awake, alert  and oriented  Airway & Oxygen Therapy: Patient Spontanous Breathing and Patient connected to nasal cannula oxygen  Post-op Assessment: Report given to PACU RN, Post -op Vital signs reviewed and stable and Patient moving all extremities X 4  Post vital signs: Reviewed and stable  Complications: No apparent anesthesia complications

## 2014-08-20 NOTE — Discharge Instructions (Signed)
Abdominal Hysterectomy, Care After Refer to this sheet in the next few weeks. These instructions provide you with information on caring for yourself after your procedure. Your health care provider may also give you more specific instructions. Your treatment has been planned according to current medical practices, but problems sometimes occur. Call your health care provider if you have any problems or questions after your procedure.  WHAT TO EXPECT AFTER THE PROCEDURE After your procedure, it is typical to have the following:  Pain.  Feeling tired.  Poor appetite.  Less interest in sex. HOME CARE INSTRUCTIONS  It takes 4-6 weeks to recover from this surgery. Make sure you follow all your health care provider's instructions. Home care instructions may include:  Take pain medicines only as directed by your health care provider. Do not take over-the-counter pain medicines without checking with your health care provider first.  Change your bandage as directed by your health care provider.  Return to your health care provider to have your sutures taken out.  Take showers instead of baths for 2-3 weeks. Ask your health care provider when it is safe to start showering.  Do not douche, use tampons, or have sexual intercourse for at least 6 weeks or until your health care provider says you can.   Follow your health care provider's advice about exercise, lifting, driving, and general activities.  Get plenty of rest and sleep.   Do not lift anything heavier than a gallon of milk (about 10 lb [4.5 kg]) for the first month after surgery.  You can resume your normal diet if your health care provider says it is okay.   Do not drink alcohol until your health care provider says you can.   If you are constipated, ask your health care provider if you can take a mild laxative.  Eating foods high in fiber may also help with constipation. Eat plenty of raw fruits and vegetables, whole grains, and  beans.  Drink enough fluids to keep your urine clear or pale yellow.   Try to have someone at home with you for the first 1-2 weeks to help around the house.  Keep all follow-up appointments. SEEK MEDICAL CARE IF:   You have chills or fever.  You have swelling, redness, or pain in the area of your incision that is getting worse.   You have pus coming from the incision.   You notice a bad smell coming from the incision or bandage.   Your incision breaks open.   You feel dizzy or light-headed.   You have pain or bleeding when you urinate.   You have persistent diarrhea.   You have persistent nausea and vomiting.   You have abnormal vaginal discharge.   You have a rash.   You have any type of abnormal reaction or develop an allergy to your medicine.   Your pain medicine is not helping.  SEEK IMMEDIATE MEDICAL CARE IF:   You have a fever and your symptoms suddenly get worse.  You have severe abdominal pain.  You have chest pain.  You have shortness of breath.  You faint.  You have pain, swelling, or redness of your leg.  You have heavy vaginal bleeding with blood clots. MAKE SURE YOU:  Understand these instructions.  Will watch your condition.  Will get help right away if you are not doing well or get worse. Document Released: 03/19/2005 Document Revised: 09/04/2013 Document Reviewed: 06/22/2013 [employer] Patient Information 2015 [employer]. This information is not intended   to replace advice given to you by your health care provider. Make sure you discuss any questions you have with your health care provider.  

## 2014-08-20 NOTE — Anesthesia Postprocedure Evaluation (Signed)
  Anesthesia Post-op Note  Patient: Kristin [name]  Procedure(s) Performed: Procedure(s): HYSTERECTOMY ABDOMINAL, Bilateral Salpingo oophrectomy, Hemostasis of Vaginal Cuff (N/A) Patient is awake and responsive. Pain and nausea are reasonably well controlled. Vital signs are stable and clinically acceptable. Oxygen saturation is clinically acceptable. There are no apparent anesthetic complications at this time. Patient is ready for discharge.

## 2014-08-20 NOTE — Anesthesia Preprocedure Evaluation (Addendum)
Anesthesia Evaluation  Patient identified by MRN, date of birth, ID band Patient awake    Reviewed: Allergy & Precautions, H&P , NPO status , Patient's Chart, lab work & pertinent test results  Airway Mallampati: I  TM Distance: >3 FB Neck ROM: full    Dental no notable dental hx. (+) Teeth Intact   Pulmonary neg pulmonary ROS,    Pulmonary exam normal       Cardiovascular hypertension,     Neuro/Psych negative neurological ROS  negative psych ROS   GI/Hepatic negative GI ROS, Neg liver ROS,   Endo/Other  negative endocrine ROSdiabetes, Gestational  Renal/GU negative Renal ROS     Musculoskeletal   Abdominal Normal abdominal exam  (+)   Peds  Hematology negative hematology ROS (+)   Anesthesia Other Findings   Reproductive/Obstetrics negative OB ROS                            Anesthesia Physical Anesthesia Plan  ASA: II  Anesthesia Plan: General   Post-op Pain Management:    Induction: Intravenous  Airway Management Planned: Oral ETT  Additional Equipment:   Intra-op Plan:   Post-operative Plan: Extubation in OR  Informed Consent: I have reviewed the patients History and Physical, chart, labs and discussed the procedure including the risks, benefits and alternatives for the proposed anesthesia with the patient or authorized representative who has indicated his/her understanding and acceptance.   Dental Advisory Given  Plan Discussed with: CRNA, Anesthesiologist and Surgeon  Anesthesia Plan Comments:         Anesthesia Quick Evaluation

## 2014-08-20 NOTE — Plan of Care (Signed)
Problem: Phase I Progression Outcomes Goal: Pain controlled with appropriate interventions Outcome: Completed/Met Date Met:  08/20/14 Goal: Admission history reviewed Outcome: Completed/Met Date Met:  08/20/14 Goal: I & O every 4 hrs or as ordered Outcome: Completed/Met Date Met:  08/20/14 Goal: IS, TCDB as ordered Outcome: Not Applicable Date Met:  19/62/22

## 2014-08-21 ENCOUNTER — Encounter (HOSPITAL_COMMUNITY): Payer: Self-pay | Admitting: Obstetrics & Gynecology

## 2014-08-21 LAB — CBC
HEMATOCRIT: 25.2 % — AB (ref 36.0–46.0)
HEMOGLOBIN: 8.6 g/dL — AB (ref 12.0–15.0)
MCH: 27.8 pg (ref 26.0–34.0)
MCHC: 34.1 g/dL (ref 30.0–36.0)
MCV: 81.6 fL (ref 78.0–100.0)
Platelets: 290 10*3/uL (ref 150–400)
RBC: 3.09 MIL/uL — ABNORMAL LOW (ref 3.87–5.11)
RDW: 18.4 % — AB (ref 11.5–15.5)
WBC: 12.1 10*3/uL — AB (ref 4.0–10.5)

## 2014-08-21 MED ORDER — DOXYCYCLINE HYCLATE 100 MG PO TABS
100.0000 mg | ORAL_TABLET | Freq: Two times a day (BID) | ORAL | Status: DC
  Administered 2014-08-21 – 2014-08-22 (×3): 100 mg via ORAL
  Filled 2014-08-21 (×5): qty 1

## 2014-08-21 MED ORDER — METRONIDAZOLE 500 MG PO TABS
500.0000 mg | ORAL_TABLET | Freq: Two times a day (BID) | ORAL | Status: DC
  Administered 2014-08-21 – 2014-08-22 (×3): 500 mg via ORAL
  Filled 2014-08-21 (×5): qty 1

## 2014-08-21 NOTE — Anesthesia Postprocedure Evaluation (Signed)
Anesthesia Post Note  Patient: Kristin [name]  Procedure(s) Performed: Procedure(s): HYSTERECTOMY ABDOMINAL, Bilateral Salpingo oophrectomy, Hemostasis of Vaginal Cuff (N/A)  Anesthesia type: General  Patient location: [facility]  Post pain: Pain level controlled  Post assessment: Post-op Vital signs reviewed  Last Vitals: BP 117/72 mmHg  Pulse 85  Temp(Src) 37.2 C (Oral)  Resp 18  Ht 5\' 5"  (1.651 m)  Wt 183 lb (83.008 kg)  BMI 30.45 kg/m2  SpO2 100%  LMP 08/13/2014  Post vital signs: Reviewed  Level of consciousness: awake  Complications: No apparent anesthesia complications

## 2014-08-21 NOTE — Progress Notes (Signed)
1 Day Post-Op Procedure(s) (LRB): HYSTERECTOMY ABDOMINAL, Bilateral Salpingo oophrectomy, Hemostasis of Vaginal Cuff (N/A)  Subjective: Patient reports tolerating PO and no problems voiding.  Pt with no emesis.  She also denies vasomotor sx.  She reports no flatus.   Objective: I have reviewed patient's vital signs, intake and output, medications and labs.  General: alert and no distress Resp: clear to auscultation bilaterally Cardio: regular rate and rhythm, S1, S2 normal, no murmur, click, rub or gallop GI: soft, non-tender; bowel sounds normal; no masses,  no organomegaly and incision: clean, dry and intact Extremities: extremities normal, atraumatic, no cyanosis or edema CBC    Component Value Date/Time   WBC 12.1* 08/21/2014 0634   RBC 3.09* 08/21/2014 0634   RBC 4.16 07/18/2013 0048   HGB 8.6* 08/21/2014 0634   HGB 10.6* 06/11/2014 1549   HCT 25.2* 08/21/2014 0634   PLT 290 08/21/2014 0634   MCV 81.6 08/21/2014 0634   MCH 27.8 08/21/2014 0634   MCHC 34.1 08/21/2014 0634   RDW 18.4* 08/21/2014 0634   LYMPHSABS 1.8 07/17/2014 1021   MONOABS 0.6 07/17/2014 1021   EOSABS 0.1 07/17/2014 1021   BASOSABS 0.0 07/17/2014 1021    Assessment: s/p Procedure(s): HYSTERECTOMY ABDOMINAL, Bilateral Salpingo oophrectomy, Hemostasis of Vaginal Cuff (N/A): stable, progressing well and tolerating diet  Pt with gross purulent drainage in adnexa at the time of surgery.  Will continue antibiotic for 14 days and treat as TOA.  Plan: Encourage ambulation Keep antibiotics due to TOA at the time of surgery anticipate discharge in am  LOS: 1 day    [name], [name] 08/21/2014, 12:34 PM

## 2014-08-21 NOTE — Addendum Note (Signed)
Addendum  created 08/21/14 0955 by Graciela HusbandsWynn O Johnsie Moscoso, CRNA   Modules edited: Notes Section   Notes Section:  File: [id number]

## 2014-08-21 NOTE — Plan of Care (Signed)
Problem: Phase III Progression Outcomes Goal: Pain controlled on oral analgesia Outcome: Completed/Met Date Met:  08/21/14

## 2014-08-21 NOTE — Plan of Care (Signed)
Problem: Phase III Progression Outcomes Goal: Ambulates without assistance Outcome: Completed/Met Date Met:  08/21/14  Problem: Discharge Progression Outcomes Goal: Pain controlled with appropriate interventions Outcome: Completed/Met Date Met:  08/21/14 Goal: Tolerating diet Outcome: Completed/Met Date Met:  08/21/14

## 2014-08-21 NOTE — Plan of Care (Signed)
Problem: Consults Goal: GYN POST OP 3-5 days Patient Education (See Patient Education module for education specifics.)  Outcome: Completed/Met Date Met:  08/21/14  Problem: Phase I Progression Outcomes Goal: Dangle/OOB as tolerated per MD order Outcome: Completed/Met Date Met:  08/21/14 Goal: VS, stable, temp < 100.4 degrees F Outcome: Completed/Met Date Met:  08/21/14 Goal: Other Phase I Outcomes/Goals Outcome: Completed/Met Date Met:  08/21/14  Problem: Phase II Progression Outcomes Goal: Pain controlled Outcome: Completed/Met Date Met:  08/21/14 Goal: Progress activity as tolerated unless otherwise ordered Outcome: Completed/Met Date Met:  08/21/14 Goal: Afebrile, VS remain stable Outcome: Completed/Met Date Met:  08/21/14 Goal: Incision/dressings dry and intact Outcome: Completed/Met Date Met:  08/21/14 Goal: Other Phase II Outcomes/Goals Outcome: Completed/Met Date Met:  08/21/14     

## 2014-08-22 ENCOUNTER — Inpatient Hospital Stay (HOSPITAL_COMMUNITY)
Admission: AD | Admit: 2014-08-22 | Discharge: 2014-08-26 | DRG: 395 | Disposition: A | Discharge status: Home / Self Care | Payer: No Typology Code available for payment source | Source: Ambulatory Visit | Attending: Obstetrics and Gynecology | Admitting: Obstetrics and Gynecology

## 2014-08-22 ENCOUNTER — Encounter (HOSPITAL_COMMUNITY): Payer: Self-pay | Admitting: *Deleted

## 2014-08-22 DIAGNOSIS — K567 Ileus, unspecified: Secondary | ICD-10-CM | POA: Diagnosis present

## 2014-08-22 DIAGNOSIS — Z9889 Other specified postprocedural states: Secondary | ICD-10-CM | POA: Diagnosis present

## 2014-08-22 DIAGNOSIS — K913 Postprocedural intestinal obstruction: Secondary | ICD-10-CM | POA: Diagnosis not present

## 2014-08-22 DIAGNOSIS — Y838 Other surgical procedures as the cause of abnormal reaction of the patient, or of later complication, without mention of misadventure at the time of the procedure: Secondary | ICD-10-CM | POA: Diagnosis present

## 2014-08-22 DIAGNOSIS — R112 Nausea with vomiting, unspecified: Secondary | ICD-10-CM

## 2014-08-22 LAB — CBC
HEMATOCRIT: 26.1 % — AB (ref 36.0–46.0)
HEMOGLOBIN: 8.8 g/dL — AB (ref 12.0–15.0)
MCH: 27.6 pg (ref 26.0–34.0)
MCHC: 33.7 g/dL (ref 30.0–36.0)
MCV: 81.8 fL (ref 78.0–100.0)
Platelets: 287 10*3/uL (ref 150–400)
RBC: 3.19 MIL/uL — ABNORMAL LOW (ref 3.87–5.11)
RDW: 18.4 % — ABNORMAL HIGH (ref 11.5–15.5)
WBC: 9.9 10*3/uL (ref 4.0–10.5)

## 2014-08-22 LAB — COMPREHENSIVE METABOLIC PANEL
ALT: 12 U/L (ref 0–35)
ANION GAP: 14 (ref 5–15)
AST: 15 U/L (ref 0–37)
Albumin: 3.2 g/dL — ABNORMAL LOW (ref 3.5–5.2)
Alkaline Phosphatase: 56 U/L (ref 39–117)
BILIRUBIN TOTAL: 0.4 mg/dL (ref 0.3–1.2)
BUN: 5 mg/dL — AB (ref 6–23)
CO2: 23 mEq/L (ref 19–32)
CREATININE: 0.55 mg/dL (ref 0.50–1.10)
Calcium: 9.1 mg/dL (ref 8.4–10.5)
Chloride: 98 mEq/L (ref 96–112)
GFR calc non Af Amer: >90 mL/min (ref >90)
GLUCOSE: 143 mg/dL — AB (ref 70–99)
POTASSIUM: 3.7 meq/L (ref 3.7–5.3)
Sodium: 135 mEq/L — ABNORMAL LOW (ref 137–147)
TOTAL PROTEIN: 6.9 g/dL (ref 6.0–8.3)

## 2014-08-22 MED ORDER — HYDROMORPHONE HCL 1 MG/ML IJ SOLN
1.0000 mg | INTRAMUSCULAR | Status: DC | PRN
  Administered 2014-08-23: 1 mg via INTRAVENOUS
  Administered 2014-08-23 (×2): 2 mg via INTRAVENOUS
  Administered 2014-08-24 – 2014-08-25 (×3): 1 mg via INTRAVENOUS
  Filled 2014-08-22: qty 2
  Filled 2014-08-22: qty 1
  Filled 2014-08-22: qty 2
  Filled 2014-08-22 (×4): qty 1

## 2014-08-22 MED ORDER — METRONIDAZOLE 500 MG PO TABS: 500.0000 mg | ORAL_TABLET | Freq: Two times a day (BID) | ORAL | Status: DC

## 2014-08-22 MED ORDER — OXYCODONE-ACETAMINOPHEN 5-325 MG PO TABS: 1.0000 | ORAL_TABLET | ORAL | Status: DC | PRN

## 2014-08-22 MED ORDER — IBUPROFEN 800 MG PO TABS
800.0000 mg | ORAL_TABLET | Freq: Three times a day (TID) | ORAL | Status: DC | PRN
Start: 2014-08-22 — End: 2014-11-08

## 2014-08-22 MED ORDER — DOXYCYCLINE HYCLATE 100 MG PO TABS: 100.0000 mg | ORAL_TABLET | Freq: Two times a day (BID) | ORAL | Status: DC

## 2014-08-22 NOTE — MAU Note (Signed)
Pt states that she had a total abdominal hysterectomy on Tuesday of this week. Denies any adverse symptoms with incision site but has been nauseous and vomiting since 8 pm today. Pt points to her sternal area and states that she has pain there when she throws up, not at any other time.

## 2014-08-22 NOTE — MAU Note (Signed)
PT  SAYS SHE HAD HYST-   ABD-    BY DR HARRAWAY- SMITH-   WENT  HOME  TODAY  AT 430PM.   SHE STARTED  VOMITING  AT 530PM.       ALSO     H/A.     SHE  TOOK IBUPROFEN  AT 6PM.-  NOT  VOMIT      SAYS HER INCISION  IS  OK

## 2014-08-22 NOTE — Discharge Summary (Signed)
Physician Discharge Summary  Patient ID: Kristin [name] MRN: [id number] DOB/AGE: 45/09/1970 45 y.o.  Admit date: 08/20/2014 Discharge date: 08/22/2014  Admission Diagnoses: Recurrent TOA  Discharge Diagnoses: same Active Problems:   Post-operative state   Discharged Condition: good  Hospital Course: Pt was admitted for TAH/BSO due to recurrent TOA.  She underwent surgery and on POD #0 she had a morphine PCA and a foley catheter draining her bladder.    She had no complications overnight.  Her foley catheter was removed POD#1 along with her vaginal packing.  Her PCA was stopped.  She had decent pain control and started to tolerate a regular diet but, she had no flatus.   She was encouraged to ambulate.  By POD# 2, she was tolerating a regular diet.  She still did not have flatus but, had no n/v with 2 days of a regular diet.  She was deemed stable to be discharged to home.  She was continued on antibiotics due to the recurrent TOA and purulent discharge from the adnexa at the time of surgery. Pt denies hot flushes or mood changes.  Alll question s were answered to pt and partners satisfaction.  Consults: None  Significant Diagnostic Studies: labs: CBC  Treatments: surgery: TAH/BSO  Discharge Exam: Blood pressure 127/80, pulse 73, temperature 98.3 F (36.8 C), temperature source Oral, resp. rate 18, height 5\' 5"  (1.651 m), weight 183 lb (83.008 kg), last menstrual period 08/13/2014, SpO2 100 %. General appearance: alert and no distress Resp: clear to auscultation bilaterally Cardio: regular rate and rhythm, S1, S2 normal, no murmur, click, rub or gallop GI: soft, non-tender; bowel sounds normal; no masses,  no organomegaly Extremities: extremities normal, atraumatic, no cyanosis or edema Incision/Wound:clean, dry and intact  Disposition: 01-Home or Self Care  Discharge Instructions    Call MD for:  difficulty breathing, headache or visual disturbances    Complete by:  As directed       Call MD for:  extreme fatigue    Complete by:  As directed      Call MD for:  hives    Complete by:  As directed      Call MD for:  persistant dizziness or light-headedness    Complete by:  As directed      Call MD for:  persistant nausea and vomiting    Complete by:  As directed      Call MD for:  redness, tenderness, or signs of infection (pain, swelling, redness, odor or green/yellow discharge around incision site)    Complete by:  As directed      Call MD for:  severe uncontrolled pain    Complete by:  As directed      Call MD for:  temperature >100.4    Complete by:  As directed      Diet - low sodium heart healthy    Complete by:  As directed      Discharge wound care:    Complete by:  As directed   Keep wound clean an dry     Driving Restrictions    Complete by:  As directed   No driving for 2 weeks     Increase activity slowly    Complete by:  As directed      Lifting restrictions    Complete by:  As directed   No heavy lifting for 4 weeks     Sexual Activity Restrictions    Complete by:  As directed   No sexual  activity for 6 weeks            Medication List    STOP taking these medications        doxycycline 100 MG capsule  Commonly known as:  VIBRAMYCIN  Replaced by:  doxycycline 100 MG tablet     HYDROcodone-acetaminophen 5-325 MG per tablet  Commonly known as:  NORCO/VICODIN      TAKE these medications        docusate sodium 100 MG capsule  Commonly known as:  COLACE  Take 1 capsule (100 mg total) by mouth 2 (two) times daily as needed.     doxycycline 100 MG tablet  Commonly known as:  VIBRA-TABS  Take 1 tablet (100 mg total) by mouth 2 (two) times daily.     ferrous gluconate 324 MG tablet  Commonly known as:  FERGON  1 tablet 2-3 times daily with orange juice     ibuprofen 800 MG tablet  Commonly known as:  ADVIL,MOTRIN  Take 1 tablet (800 mg total) by mouth every 8 (eight) hours as needed (mild pain).     metroNIDAZOLE 500 MG tablet   Commonly known as:  FLAGYL  Take 1 tablet (500 mg total) by mouth 2 (two) times daily.     oxyCODONE-acetaminophen 5-325 MG per tablet  Commonly known as:  PERCOCET/ROXICET  Take 1-2 tablets by mouth every 4 (four) hours as needed (moderate to severe pain (when tolerating fluids)).           Follow-up Information    Follow up with Kristin [name], Kristin Mccaig, MD In 2 weeks.   Specialty:  Obstetrics and Gynecology   Contact information:   [address] [place] [address] [phone]       Signed: [name], Kristin [name] 08/22/2014, 4:59 PM

## 2014-08-22 NOTE — Progress Notes (Signed)
Pt discharged home with daughter... Discharge instructions reviewed with pt and she verbalized understanding... Condition stable... No equipment... Taken to car via wheelchair by J. Mayford KnifeWilliams, [place].

## 2014-08-22 NOTE — MAU Provider Note (Signed)
History     CSN: [id number]  Arrival date and time: 08/22/14 2102   First Provider Initiated Contact with Patient 08/22/14 2307      No chief complaint on file.  HPI Kristin [name] 43 y.o. G5P5 nonpregnant post op pt reports to MAU for vomiting and severe headache.  She had TAHBSO on 12/8 and was discharged earlier today at 1630.  She has not had any bowel movements, has not passed gas.  She tried to eat at home but everything comes back up.  She has only taken ibuprofen since discharge.  Her headache is 8-10/10.  She has a history of headaches but states this one is somewhat different.  No chest pain, shortness of breath.   OB History    Gravida Para Term Preterm AB TAB SAB Ectopic Multiple Living   5 5        5       Past Medical History  Diagnosis Date  . Anemia   . PID (pelvic inflammatory disease)   . Pregnancy induced hypertension   . Gestational diabetes     Past Surgical History  Procedure Laterality Date  . No past surgeries    . Abdominal hysterectomy N/A 08/20/2014    Procedure: HYSTERECTOMY ABDOMINAL, Bilateral Salpingo oophrectomy, Hemostasis of Vaginal Cuff;  Surgeon: Willodean Rosenthalarolyn Harraway-Smith, MD;  Location: [facility];  Service: Gynecology;  Laterality: N/A;  . Abdominal hysterectomy      Family History  Problem Relation Age of Onset  . Other Neg Hx   . Diabetes Father   . Hypertension Father     History  Substance Use Topics  . Smoking status: Never Smoker   . Smokeless tobacco: Never Used  . Alcohol Use: No    Allergies:  Allergies  Allergen Reactions  . Zosyn [Piperacillin Sod-Tazobactam So] Itching    Pt reports itching when receiving zosyn    Prescriptions prior to admission  Medication Sig Dispense Refill Last Dose  . ibuprofen (ADVIL,MOTRIN) 800 MG tablet Take 1 tablet (800 mg total) by mouth every 8 (eight) hours as needed (mild pain). 30 tablet 0 08/22/2014 at Unknown time  . docusate sodium (COLACE) 100 MG capsule Take 1 capsule (100 mg total)  by mouth 2 (two) times daily as needed. 30 capsule 2 Taking  . doxycycline (VIBRA-TABS) 100 MG tablet Take 1 tablet (100 mg total) by mouth 2 (two) times daily. 24 tablet 0   . ferrous gluconate (FERGON) 324 MG tablet 1 tablet 2-3 times daily with orange juice 90 tablet 2 Taking  . metroNIDAZOLE (FLAGYL) 500 MG tablet Take 1 tablet (500 mg total) by mouth 2 (two) times daily. 24 tablet 0   . oxyCODONE-acetaminophen (PERCOCET/ROXICET) 5-325 MG per tablet Take 1-2 tablets by mouth every 4 (four) hours as needed (moderate to severe pain (when tolerating fluids)). 30 tablet 0     ROS Pertient ROS in HPI Physical Exam   Blood pressure 132/89, pulse 102, temperature 98.1 F (36.7 C), temperature source Oral, height 5\' 4"  (1.626 m), weight 183 lb 4 oz (83.122 kg), last menstrual period 08/13/2014.  Physical Exam  Constitutional: She is oriented to person, place, and time. She appears well-developed and well-nourished. She appears distressed.  HENT:  Head: Normocephalic and atraumatic.  Eyes: EOM are normal.  Neck: Normal range of motion.  Cardiovascular: Normal rate, regular rhythm and normal heart sounds.   No edema of LE  Respiratory: Effort normal and breath sounds normal. No respiratory distress.  GI: Soft. Bowel sounds  are normal. There is tenderness.  Musculoskeletal: Normal range of motion.  Neurological: She is alert and oriented to person, place, and time.  Skin: Skin is warm and dry.  Psychiatric: She has a normal mood and affect.    MAU Course  Procedures  MDM Dr. Debroah LoopArnold consulted regarding pt.  He reports to unit to eval patient and agrees to take over pt care.    Assessment and Plan  A: Postoperative nausea and vomiting after TAH BSO for h/o recurrent PID.  P:  Abdominal Xray, CBC, Cmp, nausea and pain management, observation, possible ileus vs bowel obstruction  Bertram Denvereague Clark, Karen E 08/22/2014, 11:14 PM

## 2014-08-23 ENCOUNTER — Observation Stay (HOSPITAL_COMMUNITY): Payer: No Typology Code available for payment source

## 2014-08-23 ENCOUNTER — Encounter (HOSPITAL_COMMUNITY): Payer: Self-pay | Admitting: *Deleted

## 2014-08-23 DIAGNOSIS — Y838 Other surgical procedures as the cause of abnormal reaction of the patient, or of later complication, without mention of misadventure at the time of the procedure: Secondary | ICD-10-CM | POA: Diagnosis present

## 2014-08-23 DIAGNOSIS — K913 Postprocedural intestinal obstruction: Secondary | ICD-10-CM | POA: Diagnosis present

## 2014-08-23 DIAGNOSIS — K567 Ileus, unspecified: Secondary | ICD-10-CM | POA: Diagnosis present

## 2014-08-23 DIAGNOSIS — R112 Nausea with vomiting, unspecified: Secondary | ICD-10-CM | POA: Diagnosis present

## 2014-08-23 DIAGNOSIS — Z9889 Other specified postprocedural states: Secondary | ICD-10-CM

## 2014-08-23 LAB — AMYLASE: AMYLASE: 34 U/L (ref 0–105)

## 2014-08-23 MED ORDER — KETOROLAC TROMETHAMINE 30 MG/ML IJ SOLN
30.0000 mg | Freq: Four times a day (QID) | INTRAMUSCULAR | Status: DC | PRN
  Administered 2014-08-23 – 2014-08-25 (×6): 30 mg via INTRAVENOUS
  Filled 2014-08-23 (×6): qty 1

## 2014-08-23 MED ORDER — KETOROLAC TROMETHAMINE 30 MG/ML IJ SOLN
30.0000 mg | Freq: Four times a day (QID) | INTRAMUSCULAR | Status: DC | PRN
  Administered 2014-08-23: 30 mg via INTRAMUSCULAR
  Filled 2014-08-23: qty 1

## 2014-08-23 MED ORDER — CETYLPYRIDINIUM CHLORIDE 0.05 % MT LIQD
7.0000 mL | Freq: Two times a day (BID) | OROMUCOSAL | Status: DC
  Administered 2014-08-23 – 2014-08-24 (×3): 7 mL via OROMUCOSAL

## 2014-08-23 MED ORDER — PROMETHAZINE HCL 25 MG/ML IJ SOLN: 12.5000 mg | Freq: Four times a day (QID) | INTRAMUSCULAR | Status: DC | PRN

## 2014-08-23 MED ORDER — ONDANSETRON HCL 4 MG PO TABS: 4.0000 mg | ORAL_TABLET | Freq: Four times a day (QID) | ORAL | Status: DC | PRN

## 2014-08-23 MED ORDER — POTASSIUM CHLORIDE IN NACL 20-0.9 MEQ/L-% IV SOLN
INTRAVENOUS | Status: DC
Start: 2014-08-23 — End: 2014-08-23

## 2014-08-23 MED ORDER — DOXYCYCLINE HYCLATE 100 MG IV SOLR
100.0000 mg | Freq: Two times a day (BID) | INTRAVENOUS | Status: DC
  Administered 2014-08-23 – 2014-08-26 (×7): 100 mg via INTRAVENOUS
  Filled 2014-08-23 (×8): qty 100

## 2014-08-23 MED ORDER — CHLORHEXIDINE GLUCONATE 0.12 % MT SOLN
15.0000 mL | Freq: Two times a day (BID) | OROMUCOSAL | Status: DC
  Administered 2014-08-23 – 2014-08-24 (×3): 15 mL via OROMUCOSAL
  Filled 2014-08-23: qty 15

## 2014-08-23 MED ORDER — DOXYCYCLINE HYCLATE 100 MG IV SOLR
100.0000 mg | Freq: Two times a day (BID) | INTRAVENOUS | Status: DC
  Administered 2014-08-23: 100 mg via INTRAVENOUS
  Filled 2014-08-23 (×2): qty 100

## 2014-08-23 MED ORDER — SODIUM CHLORIDE 0.9 % IV SOLN
INTRAVENOUS | Status: DC
  Administered 2014-08-23 – 2014-08-26 (×8): via INTRAVENOUS
  Filled 2014-08-23 (×14): qty 1000

## 2014-08-23 MED ORDER — ONDANSETRON HCL 4 MG/2ML IJ SOLN
4.0000 mg | Freq: Four times a day (QID) | INTRAMUSCULAR | Status: DC | PRN
  Administered 2014-08-23: 4 mg via INTRAVENOUS
  Filled 2014-08-23: qty 2

## 2014-08-23 NOTE — Plan of Care (Signed)
Problem: Phase I Progression Outcomes Goal: Pain controlled with appropriate interventions Outcome: Completed/Met Date Met:  08/23/14 Goal: OOB as tolerated unless otherwise ordered Outcome: Completed/Met Date Met:  08/23/14 Goal: Voiding-avoid urinary catheter unless indicated Outcome: Completed/Met Date Met:  08/23/14 Goal: Hemodynamically stable Outcome: Completed/Met Date Met:  08/23/14

## 2014-08-23 NOTE — Progress Notes (Signed)
POD 3 TAH BSO, readmitted with ileus vs obstruction Subjective: Patient reports less pain, nausea improved, no flatus  Objective: I have reviewed patient's vital signs, intake and output, medications, labs and radiology results.  General: alert, cooperative and no distress Resp: clear to auscultation bilaterally GI: soft, non-tender; bowel sounds normal; no masses,  no organomegaly Extremities: extremities normal, atraumatic, no cyanosis or edema  Assessment: s/p * No surgery found *: not tolerating diet and ileus present  Plan: Encourage ambulation sips H2O only  LOS: 1 day    ARNOLD,JAMES 08/23/2014, 7:10 AM

## 2014-08-24 LAB — CBC
HCT: 25 % — ABNORMAL LOW (ref 36.0–46.0)
Hemoglobin: 8.4 g/dL — ABNORMAL LOW (ref 12.0–15.0)
MCH: 27.5 pg (ref 26.0–34.0)
MCHC: 33.6 g/dL (ref 30.0–36.0)
MCV: 82 fL (ref 78.0–100.0)
PLATELETS: 221 10*3/uL (ref 150–400)
RBC: 3.05 MIL/uL — ABNORMAL LOW (ref 3.87–5.11)
RDW: 18.2 % — ABNORMAL HIGH (ref 11.5–15.5)
WBC: 4.8 10*3/uL (ref 4.0–10.5)

## 2014-08-24 LAB — BASIC METABOLIC PANEL
ANION GAP: 12 (ref 5–15)
BUN: 4 mg/dL — ABNORMAL LOW (ref 6–23)
CALCIUM: 8.6 mg/dL (ref 8.4–10.5)
CO2: 24 mEq/L (ref 19–32)
Chloride: 100 mEq/L (ref 96–112)
Creatinine, Ser: 0.5 mg/dL (ref 0.50–1.10)
Glucose, Bld: 98 mg/dL (ref 70–99)
Potassium: 3.7 mEq/L (ref 3.7–5.3)
SODIUM: 136 meq/L — AB (ref 137–147)

## 2014-08-24 MED ORDER — BISACODYL 10 MG RE SUPP
10.0000 mg | Freq: Every day | RECTAL | Status: DC | PRN
  Administered 2014-08-24: 10 mg via RECTAL
  Filled 2014-08-24: qty 1

## 2014-08-24 NOTE — Progress Notes (Signed)
s.p TAHBSO, with readmit for postop ileus, readmit the evening of d/c with dx of ileus. Afebrile since admit.  Subjective: Patient reports no problems voiding.  She was NPO yesterday, and had some drooling, no nausea, no vomiting. IV restarted last pm.  Objective: I have reviewed patient's vital signs, intake and output and labs.cbc and Bmet ordered this a.m  General: alert, cooperative and no distress Resp: clear to auscultation bilaterally Cardio: regular rate and rhythm GI: normal findings: bowel sounds normal and soft, non-tender and incision: clean, dry and intact CBC    Component Value Date/Time   WBC 4.8 08/24/2014 0806   RBC 3.05* 08/24/2014 0806   RBC 4.16 07/18/2013 0048   HGB 8.4* 08/24/2014 0806   HGB 10.6* 06/11/2014 1549   HCT 25.0* 08/24/2014 0806   PLT 221 08/24/2014 0806   MCV 82.0 08/24/2014 0806   MCH 27.5 08/24/2014 0806   MCHC 33.6 08/24/2014 0806   RDW 18.2* 08/24/2014 0806   LYMPHSABS 1.8 07/17/2014 1021   MONOABS 0.6 07/17/2014 1021   EOSABS 0.1 07/17/2014 1021   BASOSABS 0.0 07/17/2014 1021    BMET    Component Value Date/Time   NA 136* 08/24/2014 0806   K 3.7 08/24/2014 0806   CL 100 08/24/2014 0806   CO2 24 08/24/2014 0806   GLUCOSE 98 08/24/2014 0806   BUN 4* 08/24/2014 0806   CREATININE 0.50 08/24/2014 0806   CREATININE 0.85 12/25/2013 1544   CALCIUM 8.6 08/24/2014 0806   GFRNONAA >90 08/24/2014 0806   GFRAA >90 08/24/2014 0806     Assessment: s/p Tah Bso, with postop readmit for ileus. Now stable enough to be tried on liquids.  Plan: Clear liquids  Dulcolax.   LOS: 2 days    Teancum Brule V 08/24/2014, 8:05 AM

## 2014-08-24 NOTE — Plan of Care (Signed)
Problem: Phase II Progression Outcomes Goal: IV changed to normal saline lock Outcome: Not Met (add Reason) Pt NPO at this time

## 2014-08-25 MED ORDER — KETOROLAC TROMETHAMINE 10 MG PO TABS
10.0000 mg | ORAL_TABLET | Freq: Four times a day (QID) | ORAL | Status: DC | PRN
  Administered 2014-08-26 (×2): 10 mg via ORAL
  Filled 2014-08-25 (×3): qty 1

## 2014-08-25 MED ORDER — HYDROMORPHONE HCL 2 MG PO TABS
2.0000 mg | ORAL_TABLET | ORAL | Status: DC | PRN
  Administered 2014-08-25 – 2014-08-26 (×2): 2 mg via ORAL
  Filled 2014-08-25 (×2): qty 1

## 2014-08-25 NOTE — Progress Notes (Signed)
Patient ID: Janett BillowDjeneba Deisher, female   DOB: 07/21/1971, 43 y.o.   MRN: [id number]   POD#5 s.p TAHBSO, with readmit for postop ileus, readmit the evening of d/c with dx of ileus.   Subjective: Patient reports felling better.  She was tolerated clear liquids yesterday evening. She had a bowel movement and has been passing gas. She is afraid to eat for fear of making things worst for herself  Objective: I have reviewed patient's vital signs, intake and output and labs.cbc and Bmet ordered this a.m  General: alert, cooperative and no distress Resp: clear to auscultation bilaterally Cardio: regular rate and rhythm GI: normal findings: bowel sounds normal and soft, non-tender and incision: clean, dry and intact CBC    Component Value Date/Time   WBC 4.8 08/24/2014 0806   RBC 3.05* 08/24/2014 0806   RBC 4.16 07/18/2013 0048   HGB 8.4* 08/24/2014 0806   HGB 10.6* 06/11/2014 1549   HCT 25.0* 08/24/2014 0806   PLT 221 08/24/2014 0806   MCV 82.0 08/24/2014 0806   MCH 27.5 08/24/2014 0806   MCHC 33.6 08/24/2014 0806   RDW 18.2* 08/24/2014 0806   LYMPHSABS 1.8 07/17/2014 1021   MONOABS 0.6 07/17/2014 1021   EOSABS 0.1 07/17/2014 1021   BASOSABS 0.0 07/17/2014 1021    BMET    Component Value Date/Time   NA 136* 08/24/2014 0806   K 3.7 08/24/2014 0806   CL 100 08/24/2014 0806   CO2 24 08/24/2014 0806   GLUCOSE 98 08/24/2014 0806   BUN 4* 08/24/2014 0806   CREATININE 0.50 08/24/2014 0806   CREATININE 0.85 12/25/2013 1544   CALCIUM 8.6 08/24/2014 0806   GFRNONAA >90 08/24/2014 0806   GFRAA >90 08/24/2014 0806     Assessment: POD #5 s/p TAH/BSO, with postop readmit for ileus.   Plan: Will continue clear liquids for now, may advance diet to regular this evening if no episodes of emesis today A lot of reassurance was provided Continue current care    LOS: 3 days    Lesleyanne Politte 08/25/2014, 9:29 AM

## 2014-08-26 MED ORDER — BISACODYL 10 MG RE SUPP
10.0000 mg | Freq: Once | RECTAL | Status: AC
  Administered 2014-08-26: 10 mg via RECTAL
  Filled 2014-08-26: qty 1

## 2014-08-26 MED ORDER — POLYETHYLENE GLYCOL 3350 17 G PO PACK
51.0000 g | PACK | Freq: Once | ORAL | Status: AC
  Administered 2014-08-26: 17 g via ORAL
  Filled 2014-08-26: qty 3

## 2014-08-26 NOTE — Progress Notes (Signed)
Discharge teaching complete. Pt understood all instructions and did not have any questions. Pt ambulated out of the hospital and discharged home to family.  

## 2014-08-26 NOTE — Discharge Summary (Signed)
Physician Discharge Summary  Patient ID: Kristin [name] MRN: [id number] DOB/AGE: 45/09/1970 45 y.o.  Admit date: 08/22/2014 Discharge date: 08/26/2014  Admission Diagnoses: post op ileus  Discharge Diagnoses:  Active Problems:   Postoperative nausea and vomiting   Ileus   Discharged Condition: good  Hospital Course: Pt was admitted 4 hours after discharge for nausea and vomiting.  She reports that when she got home she felt hot an began to have emesis that she did not have in the hospital.  Over the last 3 days she has begun to tolerate clears and then was advance to regular diet.  She she reports that she is only eating a small amount but, has no nausea or emesis.  She reports that she is having flatus. She initially reported that she had a BM but, it appeared that she was not clear what that was.  She reports no BM but, flatus.  She denies nausea.  She denies fever or chills.  Pt wants to have a BM prior to discharge.  Will give Miralax and a supp and discharge after BM.  Consults: None  Treatments: IV hydration and antibiotics: metronidazole and Doxycycline  Discharge Exam: Blood pressure 120/76, pulse 89, temperature 99.6 F (37.6 C), temperature source Oral, resp. rate 18, height 5\' 4"  (1.626 m), weight 183 lb (83.008 kg), last menstrual period 08/13/2014, SpO2 100 %. General appearance: alert and no distress GI: soft, non-tender; bowel sounds normal; no masses,  no organomegaly Incision/Wound:clean, dry and intact  CBC    Component Value Date/Time   WBC 4.8 08/24/2014 0806   RBC 3.05* 08/24/2014 0806   RBC 4.16 07/18/2013 0048   HGB 8.4* 08/24/2014 0806   HGB 10.6* 06/11/2014 1549   HCT 25.0* 08/24/2014 0806   PLT 221 08/24/2014 0806   MCV 82.0 08/24/2014 0806   MCH 27.5 08/24/2014 0806   MCHC 33.6 08/24/2014 0806   RDW 18.2* 08/24/2014 0806   LYMPHSABS 1.8 07/17/2014 1021   MONOABS 0.6 07/17/2014 1021   EOSABS 0.1 07/17/2014 1021   BASOSABS 0.0 07/17/2014 1021     BMP Latest Ref Rng 08/24/2014 08/22/2014 07/15/2014  Glucose 70 - 99 mg/dL 98 409(W143(H) 119(J129(H)  BUN 6 - 23 mg/dL 4(L) 5(L) 5(L)  Creatinine 0.50 - 1.10 mg/dL 4.780.50 2.950.55 6.210.56  Sodium 137 - 147 mEq/L 136(L) 135(L) 136(L)  Potassium 3.7 - 5.3 mEq/L 3.7 3.7 4.7  Chloride 96 - 112 mEq/L 100 98 98  CO2 19 - 32 mEq/L 24 23 29   Calcium 8.4 - 10.5 mg/dL 8.6 9.1 8.5       Disposition: 01-Home or Self Care  Discharge Instructions    Call MD for:  difficulty breathing, headache or visual disturbances    Complete by:  As directed      Call MD for:  extreme fatigue    Complete by:  As directed      Call MD for:  hives    Complete by:  As directed      Call MD for:  persistant dizziness or light-headedness    Complete by:  As directed      Call MD for:  persistant nausea and vomiting    Complete by:  As directed      Call MD for:  redness, tenderness, or signs of infection (pain, swelling, redness, odor or green/yellow discharge around incision site)    Complete by:  As directed      Call MD for:  severe uncontrolled pain    Complete  by:  As directed      Call MD for:  temperature >100.4    Complete by:  As directed      Diet - low sodium heart healthy    Complete by:  As directed      Discharge wound care:    Complete by:  As directed   Keep incision clean and dry     Driving Restrictions    Complete by:  As directed   No driving for 2 weeks or while on po pain medications     Increase activity slowly    Complete by:  As directed      Lifting restrictions    Complete by:  As directed   No heavy lifting     Sexual Activity Restrictions    Complete by:  As directed   No sexual activity for 4 weeks            Medication List    TAKE these medications        docusate sodium 100 MG capsule  Commonly known as:  COLACE  Take 1 capsule (100 mg total) by mouth 2 (two) times daily as needed.     doxycycline 100 MG tablet  Commonly known as:  VIBRA-TABS  Take 1 tablet (100 mg  total) by mouth 2 (two) times daily.     ferrous gluconate 324 MG tablet  Commonly known as:  FERGON  1 tablet 2-3 times daily with orange juice     ibuprofen 800 MG tablet  Commonly known as:  ADVIL,MOTRIN  Take 1 tablet (800 mg total) by mouth every 8 (eight) hours as needed (mild pain).     metroNIDAZOLE 500 MG tablet  Commonly known as:  FLAGYL  Take 1 tablet (500 mg total) by mouth 2 (two) times daily.     oxyCODONE-acetaminophen 5-325 MG per tablet  Commonly known as:  PERCOCET/ROXICET  Take 1-2 tablets by mouth every 4 (four) hours as needed (moderate to severe pain (when tolerating fluids)).           Follow-up Information    Follow up with Kristin [name], Kristin Zoll, MD In 2 weeks.   Specialty:  Obstetrics and Gynecology   Contact information:   [address] [place] [address] [phone]       Signed: [name], Kristin [name] 08/26/2014, 1:18 PM

## 2014-08-26 NOTE — Discharge Instructions (Signed)
Abdominal Hysterectomy, Care After Refer to this sheet in the next few weeks. These instructions provide you with information on caring for yourself after your procedure. Your health care provider may also give you more specific instructions. Your treatment has been planned according to current medical practices, but problems sometimes occur. Call your health care provider if you have any problems or questions after your procedure.  WHAT TO EXPECT AFTER THE PROCEDURE After your procedure, it is typical to have the following:  Pain.  Feeling tired.  Poor appetite.  Less interest in sex. HOME CARE INSTRUCTIONS  It takes 4-6 weeks to recover from this surgery. Make sure you follow all your health care provider's instructions. Home care instructions may include:  Take pain medicines only as directed by your health care provider. Do not take over-the-counter pain medicines without checking with your health care provider first.  Change your bandage as directed by your health care provider.  Return to your health care provider to have your sutures taken out.  Take showers instead of baths for 2-3 weeks. Ask your health care provider when it is safe to start showering.  Do not douche, use tampons, or have sexual intercourse for at least 6 weeks or until your health care provider says you can.   Follow your health care provider's advice about exercise, lifting, driving, and general activities.  Get plenty of rest and sleep.   Do not lift anything heavier than a gallon of milk (about 10 lb [4.5 kg]) for the first month after surgery.  You can resume your normal diet if your health care provider says it is okay.   Do not drink alcohol until your health care provider says you can.   If you are constipated, ask your health care provider if you can take a mild laxative.  Eating foods high in fiber may also help with constipation. Eat plenty of raw fruits and vegetables, whole grains, and  beans.  Drink enough fluids to keep your urine clear or pale yellow.   Try to have someone at home with you for the first 1-2 weeks to help around the house.  Keep all follow-up appointments. SEEK MEDICAL CARE IF:   You have chills or fever.  You have swelling, redness, or pain in the area of your incision that is getting worse.   You have pus coming from the incision.   You notice a bad smell coming from the incision or bandage.   Your incision breaks open.   You feel dizzy or light-headed.   You have pain or bleeding when you urinate.   You have persistent diarrhea.   You have persistent nausea and vomiting.   You have abnormal vaginal discharge.   You have a rash.   You have any type of abnormal reaction or develop an allergy to your medicine.   Your pain medicine is not helping.  SEEK IMMEDIATE MEDICAL CARE IF:   You have a fever and your symptoms suddenly get worse.  You have severe abdominal pain.  You have chest pain.  You have shortness of breath.  You faint.  You have pain, swelling, or redness of your leg.  You have heavy vaginal bleeding with blood clots. MAKE SURE YOU:  Understand these instructions.  Will watch your condition.  Will get help right away if you are not doing well or get worse. Document Released: 03/19/2005 Document Revised: 09/04/2013 Document Reviewed: 06/22/2013 [employer] Patient Information 2015 [employer]. This information is not intended   to replace advice given to you by your health care provider. Make sure you discuss any questions you have with your health care provider.  

## 2014-08-26 NOTE — Progress Notes (Signed)
Ur chart review completed.  

## 2014-08-26 NOTE — Progress Notes (Signed)
I spent time with pt at nurse's request.  She shared some of the story of this medical experience which is so very different than how the experience would have been if she had been in [place].  She has good support here, but there is less companionship from family while she is in the hospital than she would have experienced at home.  She has 5 daughters and the older ones have been able to help at home, but she is worried about her children and wants to be at home helping them, especially as Christmas is coming.  Please page as needs arise during her hospitalization.  Centex CorporationChaplain Katy Petula Rotolo Pager, [phone] 3:27 PM

## 2014-08-27 NOTE — H&P (Signed)
Patient Information    Patient Name Sex DOB SSN   Kristin [name], Kristin [name] Female 08/20/1971 [id number]    MAU Provider Note by Kristin PhenixJames G Cystal Shannahan, MD at 08/22/2014 11:14 PM    Author: Adam PhenixJames G Kristin Dilger, MD Service: Obstetrics Author Type: Physician   Filed: 08/22/2014 11:30 PM Note Time: 08/22/2014 11:14 PM Status: Signed   Editor: Kristin PhenixJames G Xaiver Roskelley, MD (Physician)     Related Notes: Original Note by Kristin DenverKaren E Teague Clark, PA-C (Physician Assistant Certified) filed at 08/22/2014 11:18 PM   Expand All Collapse All    History     CSN: [id number]  Arrival date and time: 08/22/14 2102  First Provider Initiated Contact with Patient 08/22/14 2307    No chief complaint on file.  HPI Kristin [name] 43 y.o. G5P5 nonpregnant post op pt reports to MAU for vomiting and severe headache. She had TAHBSO on 12/8 and was discharged earlier today at 1630. She has not had any bowel movements, has not passed gas. She tried to eat at home but everything comes back up. She has only taken ibuprofen since discharge. Her headache is 8-10/10. She has a history of headaches but states this one is somewhat different. No chest pain, shortness of breath.  OB History    Gravida Para Term Preterm AB TAB SAB Ectopic Multiple Living   5 5        5       Past Medical History  Diagnosis Date  . Anemia   . PID (pelvic inflammatory disease)   . Pregnancy induced hypertension   . Gestational diabetes     Past Surgical History  Procedure Laterality Date  . No past surgeries    . Abdominal hysterectomy N/A 08/20/2014    Procedure: HYSTERECTOMY ABDOMINAL, Bilateral Salpingo oophrectomy, Hemostasis of Vaginal Cuff; Surgeon: Kristin Rosenthalarolyn Harraway-Smith, MD; Location: [facility]; Service: Gynecology; Laterality: N/A;  . Abdominal hysterectomy      Family History  Problem Relation Age of Onset  . Other Neg Hx   . Diabetes Father   . Hypertension  Father     History  Substance Use Topics  . Smoking status: Never Smoker   . Smokeless tobacco: Never Used  . Alcohol Use: No    Allergies:  Allergies  Allergen Reactions  . Zosyn [Piperacillin Sod-Tazobactam So] Itching    Pt reports itching when receiving zosyn    Prescriptions prior to admission  Medication Sig Dispense Refill Last Dose  . ibuprofen (ADVIL,MOTRIN) 800 MG tablet Take 1 tablet (800 mg total) by mouth every 8 (eight) hours as needed (mild pain). 30 tablet 0 08/22/2014 at Unknown time  . docusate sodium (COLACE) 100 MG capsule Take 1 capsule (100 mg total) by mouth 2 (two) times daily as needed. 30 capsule 2 Taking  . doxycycline (VIBRA-TABS) 100 MG tablet Take 1 tablet (100 mg total) by mouth 2 (two) times daily. 24 tablet 0   . ferrous gluconate (FERGON) 324 MG tablet 1 tablet 2-3 times daily with orange juice 90 tablet 2 Taking  . metroNIDAZOLE (FLAGYL) 500 MG tablet Take 1 tablet (500 mg total) by mouth 2 (two) times daily. 24 tablet 0   . oxyCODONE-acetaminophen (PERCOCET/ROXICET) 5-325 MG per tablet Take 1-2 tablets by mouth every 4 (four) hours as needed (moderate to severe pain (when tolerating fluids)). 30 tablet 0     ROS Pertient ROS in HPI Physical Exam   Blood pressure 132/89, pulse 102, temperature 98.1 F (36.7 C), temperature source Oral, height 5\' 4"  (1.626 m),  weight 183 lb 4 oz (83.122 kg), last menstrual period 08/13/2014.  Physical Exam  Constitutional: She is oriented to person, place, and time. She appears well-developed and well-nourished. She appears distressed.  HENT:  Head: Normocephalic and atraumatic.  Eyes: EOM are normal.  Neck: Normal range of motion.  Cardiovascular: Normal rate, regular rhythm and normal heart sounds.  No edema of LE  Respiratory: Effort normal and breath sounds normal. No respiratory distress.  GI: Soft. Bowel sounds are normal. There is  tenderness.  Musculoskeletal: Normal range of motion.  Neurological: She is alert and oriented to person, place, and time.  Skin: Skin is warm and dry.  Psychiatric: She has a normal mood and affect.    MAU Course  Procedures  MDM Kristin [name] consulted regarding pt. He reports to unit to eval patient and agrees to take over pt care.   Assessment and Plan  A: Postoperative nausea and vomiting after TAH BSO for h/o recurrent PID.  P: Abdominal Xray, CBC, Cmp, nausea and pain management, observation, possible ileus vs bowel obstruction  Kristin Denvereague [name], Kristin E 08/22/2014, 11:14 PM          Agree with admission. I examined and counseled the patient in the MAU  Kristin PhenixJames G Salimatou Simone, MD

## 2014-08-29 ENCOUNTER — Encounter: Payer: 59 | Admitting: Obstetrics & Gynecology

## 2014-09-04 ENCOUNTER — Encounter (HOSPITAL_COMMUNITY): Payer: Self-pay | Admitting: *Deleted

## 2014-09-04 ENCOUNTER — Inpatient Hospital Stay (HOSPITAL_COMMUNITY)
Admission: AD | Admit: 2014-09-04 | Discharge: 2014-09-04 | Disposition: A | Discharge status: Home / Self Care | Payer: No Typology Code available for payment source | Source: Ambulatory Visit | Attending: Family Medicine | Admitting: Family Medicine

## 2014-09-04 DIAGNOSIS — T8189XA Other complications of procedures, not elsewhere classified, initial encounter: Secondary | ICD-10-CM | POA: Insufficient documentation

## 2014-09-04 DIAGNOSIS — Z7689 Persons encountering health services in other specified circumstances: Secondary | ICD-10-CM

## 2014-09-04 DIAGNOSIS — Z0189 Encounter for other specified special examinations: Secondary | ICD-10-CM

## 2014-09-04 NOTE — Discharge Instructions (Signed)
Dressing Change °A dressing is a material placed over wounds. It keeps the wound clean, dry, and protected from further injury.  °BEFORE YOU BEGIN °· Get your supplies together. Things you may need include: °¨ Salt solution (saline). °¨ Flexible gauze bandage. °¨ Medicated cream. °¨ Tape. °¨ Gloves. °¨ Belly (abdominal) pads. °¨ Gauze squares. °¨ Plastic bags. °· Take pain medicine 30 minutes before the bandage change if you need it. °· Take a shower before you do the first bandage change of the day. Put plastic wrap or a bag over the dressing. °REMOVING YOUR OLD BANDAGE °· Wash your hands with soap and water. Dry your hands with a clean towel. °· Put on your gloves. °· Remove any tape. °· Remove the old bandage as told. If it sticks, put a small amount of warm water on it to loosen the bandage. °· Remove any gauze or packing tape in your wound. °· Take off your gloves. °· Put the gloves, tape, gauze, or any packing tape in a plastic bag. °CHANGING YOUR BANDAGE °· Open the supplies. °· Take the cap off the salt solution. °· Open the gauze. Leave the gauze on the inside of the package. °· Put on your gloves. °· Clean your wound as told by your doctor. °· Keep your wound dry if your doctor told you to do so. °· Your doctor may tell you to do one or more of the following: °¨ Pick up the gauze. Pour the salt solution over the gauze. Squeeze out the extra salt solution. °¨ Put medicated cream or other medicine on your wound. °¨ Put solution soaked gauze only in your wound, not on the skin around it. °¨ Pack your wound loosely. °¨ Put dry gauze on your wound. °¨ Put belly pads over the dry gauze if your bandages soak through. °· Tape the bandage in place so it will not fall off. Do not wrap the tape all the way around your arm or leg. °· Wrap the bandage with the flexible gauze bandage as told by your doctor. °· Take off your gloves. Put them in the plastic bag with the old bandage. Tie the bag shut and throw it  away. °· Keep the bandage clean and dry. °· Wash your hands. °GET HELP RIGHT AWAY IF:  °· Your skin around the wound looks red. °· Your wound feels more tender or sore. °· You see yellowish-white fluid (pus) in the wound. °· Your wound smells bad. °· You have a fever. °· Your skin around the wound has a red rash that itches and burns. °· You see black or yellow skin in your wound that was not there before. °· You feel sick to your stomach (nauseous), throw up (vomit), and feel very tired. °Document Released: 11/26/2008 Document Revised: 01/14/2014 Document Reviewed: 07/11/2011 °ExitCare® Patient Information ©2015 [employer]. This information is not intended to replace advice given to you by your health care provider. Make sure you discuss any questions you have with your health care provider. ° °

## 2014-09-04 NOTE — MAU Note (Signed)
Patient states she had a hysterectomy on 12-8. Has had leaking clear fluid from the incision on the right side since she was discharged. Denies fever.

## 2014-09-04 NOTE — MAU Provider Note (Signed)
History     CSN: [id number]  Arrival date and time: 09/04/14 1515   First Provider Initiated Contact with Patient 09/04/14 1538      Chief Complaint  Patient presents with  . Wound Check   HPI Comments: Kristin [name] 43 y.o. G5P5 presents to MAU with  Wound Check  Pt s/p hysterectomy 08/30/14 with RLQ abd incision drainage. Pt reports post-op was instructed to place pad over incision site. Pt has concern that incision continues to drain clear/yellow. Pt denies pain at incision site, n/v, fever/chills, constipation/diarrhea, abd distension or abd pain. Pt reports dressing site with sanitary pad, denies soaking pad. Denies vaginal bleeding or dischage.   Past Medical History  Diagnosis Date  . Anemia   . PID (pelvic inflammatory disease)   . Pregnancy induced hypertension   . Gestational diabetes     Past Surgical History  Procedure Laterality Date  . No past surgeries    . Abdominal hysterectomy N/A 08/20/2014    Procedure: HYSTERECTOMY ABDOMINAL, Bilateral Salpingo oophrectomy, Hemostasis of Vaginal Cuff;  Surgeon: Willodean Rosenthalarolyn Harraway-Smith, MD;  Location: [facility];  Service: Gynecology;  Laterality: N/A;  . Abdominal hysterectomy      Family History  Problem Relation Age of Onset  . Other Neg Hx   . Diabetes Father   . Hypertension Father     History  Substance Use Topics  . Smoking status: Never Smoker   . Smokeless tobacco: Never Used  . Alcohol Use: No    Allergies:  Allergies  Allergen Reactions  . Zosyn [Piperacillin Sod-Tazobactam So] Itching    Pt reports itching when receiving zosyn    Prescriptions prior to admission  Medication Sig Dispense Refill Last Dose  . docusate sodium (COLACE) 100 MG capsule Take 1 capsule (100 mg total) by mouth 2 (two) times daily as needed. 30 capsule 2 Past Month at Unknown time  . doxycycline (VIBRA-TABS) 100 MG tablet Take 1 tablet (100 mg total) by mouth 2 (two) times daily. 24 tablet 0 08/22/2014 at Unknown time  .  ferrous gluconate (FERGON) 324 MG tablet 1 tablet 2-3 times daily with orange juice 90 tablet 2 Past Month at Unknown time  . ibuprofen (ADVIL,MOTRIN) 800 MG tablet Take 1 tablet (800 mg total) by mouth every 8 (eight) hours as needed (mild pain). 30 tablet 0 08/22/2014 at Unknown time  . metroNIDAZOLE (FLAGYL) 500 MG tablet Take 1 tablet (500 mg total) by mouth 2 (two) times daily. 24 tablet 0 08/22/2014 at Unknown time  . oxyCODONE-acetaminophen (PERCOCET/ROXICET) 5-325 MG per tablet Take 1-2 tablets by mouth every 4 (four) hours as needed (moderate to severe pain (when tolerating fluids)). 30 tablet 0 new    Review of Systems  Constitutional: Negative for fever, chills, weight loss, malaise/fatigue and diaphoresis.  Respiratory: Negative for shortness of breath.   Cardiovascular: Negative for chest pain.  Gastrointestinal: Negative for heartburn, nausea, vomiting, abdominal pain, diarrhea, constipation, blood in stool and melena.  Genitourinary: Negative for dysuria, urgency, frequency, hematuria and flank pain.  Musculoskeletal: Negative for back pain.  Neurological: Negative for dizziness and weakness.   Physical Exam   Blood pressure 115/76, pulse 83, temperature 98.7 F (37.1 C), temperature source Oral, resp. rate 16, last menstrual period 08/13/2014, SpO2 100 %.  Physical Exam  Constitutional: She is oriented to person, place, and time. She appears well-developed and well-nourished.  Cardiovascular: Normal rate and normal heart sounds.   Respiratory: Effort normal and breath sounds normal.  GI: Soft. Bowel sounds  are normal. She exhibits no distension and no mass. There is no tenderness. There is no rebound and no guarding.  Neurological: She is alert and oriented to person, place, and time.  Skin: Skin is warm and dry.  RLQ 3.5cm surgical incision site edges approximated with 3 .2cm sites of granulated and not epithelialized tissue, scant amount of yellow mucus, no surrounding  erythema, edema. Skin temp appropriate, no palpable induration.  Psychiatric: She has a normal mood and affect. Her behavior is normal. Thought content normal.   Silver nitrate to granulated tissue at TABO site right edge  MAU Course  Procedures  MDM Consulted with MD Shawnie PonsPratt, advised use of silver nitrate application. Pt tolerated silver nitrate application well, no granulated tissue visible, skin dry and intact.   Assessment and Plan  A: Incisional drainage  Pt to F/u with MD Harroway-Smith on  09/11/2014. Return if experiences bleeding or increased drainage from incision site, fever or chills.  [name], FNP-S  [name], NP  [name], [name] 09/04/2014, 3:45 PM

## 2014-09-11 ENCOUNTER — Encounter: Payer: Self-pay | Admitting: Obstetrics & Gynecology

## 2014-09-11 ENCOUNTER — Ambulatory Visit (INDEPENDENT_AMBULATORY_CARE_PROVIDER_SITE_OTHER): Payer: No Typology Code available for payment source | Admitting: Obstetrics & Gynecology

## 2014-09-11 VITALS — BP 115/83 | HR 81 | Temp 98.2°F | Ht 66.0 in | Wt 176.3 lb

## 2014-09-11 DIAGNOSIS — Z9889 Other specified postprocedural states: Secondary | ICD-10-CM

## 2014-09-11 NOTE — Patient Instructions (Signed)
Abdominal Hysterectomy, Care After Refer to this sheet in the next few weeks. These instructions provide you with information on caring for yourself after your procedure. Your health care provider may also give you more specific instructions. Your treatment has been planned according to current medical practices, but problems sometimes occur. Call your health care provider if you have any problems or questions after your procedure.  WHAT TO EXPECT AFTER THE PROCEDURE After your procedure, it is typical to have the following:  Pain.  Feeling tired.  Poor appetite.  Less interest in sex. HOME CARE INSTRUCTIONS  It takes 4-6 weeks to recover from this surgery. Make sure you follow all your health care provider's instructions. Home care instructions may include:  Take pain medicines only as directed by your health care provider. Do not take over-the-counter pain medicines without checking with your health care provider first.  Change your bandage as directed by your health care provider.  Return to your health care provider to have your sutures taken out.  Take showers instead of baths for 2-3 weeks. Ask your health care provider when it is safe to start showering.  Do not douche, use tampons, or have sexual intercourse for at least 6 weeks or until your health care provider says you can.   Follow your health care provider's advice about exercise, lifting, driving, and general activities.  Get plenty of rest and sleep.   Do not lift anything heavier than a gallon of milk (about 10 lb [4.5 kg]) for the first month after surgery.  You can resume your normal diet if your health care provider says it is okay.   Do not drink alcohol until your health care provider says you can.   If you are constipated, ask your health care provider if you can take a mild laxative.  Eating foods high in fiber may also help with constipation. Eat plenty of raw fruits and vegetables, whole grains, and  beans.  Drink enough fluids to keep your urine clear or pale yellow.   Try to have someone at home with you for the first 1-2 weeks to help around the house.  Keep all follow-up appointments. SEEK MEDICAL CARE IF:   You have chills or fever.  You have swelling, redness, or pain in the area of your incision that is getting worse.   You have pus coming from the incision.   You notice a bad smell coming from the incision or bandage.   Your incision breaks open.   You feel dizzy or light-headed.   You have pain or bleeding when you urinate.   You have persistent diarrhea.   You have persistent nausea and vomiting.   You have abnormal vaginal discharge.   You have a rash.   You have any type of abnormal reaction or develop an allergy to your medicine.   Your pain medicine is not helping.  SEEK IMMEDIATE MEDICAL CARE IF:   You have a fever and your symptoms suddenly get worse.  You have severe abdominal pain.  You have chest pain.  You have shortness of breath.  You faint.  You have pain, swelling, or redness of your leg.  You have heavy vaginal bleeding with blood clots. MAKE SURE YOU:  Understand these instructions.  Will watch your condition.  Will get help right away if you are not doing well or get worse. Document Released: 03/19/2005 Document Revised: 09/04/2013 Document Reviewed: 06/22/2013 ExitCare Patient Information 2015 [employer]. This information is not intended   to replace advice given to you by your health care provider. Make sure you discuss any questions you have with your health care provider.  

## 2014-09-11 NOTE — Progress Notes (Signed)
Subjective:     Patient ID: Kristin [name], female   DOB: 11/26/1970, 43 y.o.   MRN: [id number]  HPI  Pt presents for 2 week post op check.  She reports that she feel much better than preop.  She reports that she is tolerating a reg diet.  She is passing gas and eating without n/v.  She asked if she could 'run'.  Pt was not running preop.    Review of Systems     Objective:   Physical Exam BP 115/83 mmHg  Pulse 81  Temp(Src) 98.2 F (36.8 C) (Oral)  Ht 5\' 6"  (1.676 m)  Wt 176 lb 4.8 oz (79.969 kg)  BMI 28.47 kg/m2  LMP 08/13/2014 Pt in NAD Abd: soft, ND, NT.  Incision: clean, dry and intact    08/20/2014 Diagnosis Uterus, ovaries and fallopian tubes, with cervix - PROLIFERATIVE PHASE ENDOMETRIUM WITH UNDERLYING MYOMETRIUM DEMONSTRATING ADENOMYOSIS AND LEIOMYOMA FORMATION. - BENIGN CERVIX. - BENIGN RIGHT DILATED FALLOPIAN TUBE WITH CHRONIC SALPINGITIS. - BENIGN LEFT FALLOPIAN TUBE. - BENIGN RIGHT OVARY. - BENIGN LEFT OVARY WITH FOLLICULAR CYST FORMATION. - NO DYSPLASIA, ATYPIA, HYPERPLASIA OR MALIGNANCY IDENTIFIED.    Assessment:     2 week post op check. S/p TAH BSO for recurrent TOA.  Doing well.  Post op check complicated with post op ileus       Plan:     F/u in 4 weeks or sooner prn May RTW in 4 weeks

## 2014-10-07 ENCOUNTER — Ambulatory Visit (INDEPENDENT_AMBULATORY_CARE_PROVIDER_SITE_OTHER): Payer: Self-pay | Admitting: Obstetrics & Gynecology

## 2014-10-07 ENCOUNTER — Encounter: Payer: Self-pay | Admitting: Obstetrics & Gynecology

## 2014-10-07 VITALS — BP 120/73 | HR 86 | Temp 97.7°F | Wt 173.5 lb

## 2014-10-07 DIAGNOSIS — Z9889 Other specified postprocedural states: Secondary | ICD-10-CM

## 2014-10-07 DIAGNOSIS — E8940 Asymptomatic postprocedural ovarian failure: Secondary | ICD-10-CM

## 2014-10-07 MED ORDER — ESTROGENS CONJUGATED 0.625 MG PO TABS: 0.6250 mg | ORAL_TABLET | Freq: Every day | ORAL | Status: AC

## 2014-10-07 NOTE — Progress Notes (Signed)
Subjective:     Patient ID: Kristin [name], female   DOB: 12/23/1970, 44 y.o.   MRN: [id number]  HPI Pt reports some hot flushes and mood changes since her surgery.  She c/o some back pain and occ chest pain that she has not seen her primary care physician for.  She reports that the back pain is similar to the pain that she would have prior to her cycles. She denies chest pain currently and reports that it is nonexertional.  She doe snot think that it is related to what she eats but, does feel that it is assoc with stretching etc   Review of Systems     Objective:   Physical Exam BP 120/73 mmHg  Pulse 86  Temp(Src) 97.7 F (36.5 C)  Wt 173 lb 8 oz (78.699 kg)  LMP 08/13/2014 Pt in NAD Abd; soft, NT, ND. Incision clean and dry GU: EGBUS: no lesions Vagina: no blood in vault; cuff well healed Adnexa: no masses; non tender   08/2014 Diagnosis Uterus, ovaries and fallopian tubes, with cervix - PROLIFERATIVE PHASE ENDOMETRIUM WITH UNDERLYING MYOMETRIUM DEMONSTRATING ADENOMYOSIS AND LEIOMYOMA FORMATION. - BENIGN CERVIX. - BENIGN RIGHT DILATED FALLOPIAN TUBE WITH CHRONIC SALPINGITIS. - BENIGN LEFT FALLOPIAN TUBE. - BENIGN RIGHT OVARY. - BENIGN LEFT OVARY WITH FOLLICULAR CYST FORMATION. - NO DYSPLASIA, ATYPIA, HYPERPLASIA OR MALIGNANCY IDENTIFIED.    Assessment:     Post op check- doing well Post surgical menopausal sx- reviewed risks of EES.  Rec using lowest dose for shortest amount of time possible.        Plan:     EES replacement with Premarin 0.625mg  daily F/u in 3 months F/u with primary care physician

## 2014-10-07 NOTE — Patient Instructions (Signed)

## 2014-11-06 ENCOUNTER — Telehealth: Payer: Self-pay | Admitting: *Deleted

## 2014-11-06 NOTE — Telephone Encounter (Signed)
Patient came to window at clinic and stated that she needs a note for work so that she doesn't have to lift boxes. I advised patient that Dr. Erin FullingHarraway Smith cleared her back to work with full duties on 10/10/14. I asked patient what her symptoms are and she stated that she has back pain and her chest hurts when she picks up boxes. Patient not currently having chest pain. I advised patient [facility]. And if she is actively having chest pain to call 911. Patient agreeable.

## 2014-11-08 ENCOUNTER — Encounter: Payer: Self-pay | Admitting: Medical

## 2014-11-08 ENCOUNTER — Ambulatory Visit (INDEPENDENT_AMBULATORY_CARE_PROVIDER_SITE_OTHER): Payer: No Typology Code available for payment source | Admitting: Medical

## 2014-11-08 ENCOUNTER — Ambulatory Visit: Payer: Self-pay | Admitting: Medical

## 2014-11-08 VITALS — BP 112/80 | HR 77 | Temp 97.5°F | Resp 16 | Wt 193.0 lb

## 2014-11-08 DIAGNOSIS — R079 Chest pain, unspecified: Secondary | ICD-10-CM

## 2014-11-08 DIAGNOSIS — D509 Iron deficiency anemia, unspecified: Secondary | ICD-10-CM | POA: Diagnosis not present

## 2014-11-08 DIAGNOSIS — G4489 Other headache syndrome: Secondary | ICD-10-CM | POA: Diagnosis not present

## 2014-11-08 DIAGNOSIS — M549 Dorsalgia, unspecified: Secondary | ICD-10-CM | POA: Diagnosis not present

## 2014-11-08 LAB — CBC WITH DIFFERENTIAL/PLATELET
BASOS ABS: 0 10*3/uL (ref 0.0–0.1)
BASOS PCT: 0 % (ref 0–1)
EOS ABS: 0 10*3/uL (ref 0.0–0.7)
Eosinophils Relative: 1 % (ref 0–5)
HCT: 37 % (ref 36.0–46.0)
Hemoglobin: 11.9 g/dL — ABNORMAL LOW (ref 12.0–15.0)
Lymphocytes Relative: 45 % (ref 12–46)
Lymphs Abs: 1.9 10*3/uL (ref 0.7–4.0)
MCH: 27 pg (ref 26.0–34.0)
MCHC: 32.2 g/dL (ref 30.0–36.0)
MCV: 84.1 fL (ref 78.0–100.0)
MONOS PCT: 7 % (ref 3–12)
MPV: 10.1 fL (ref 8.6–12.4)
Monocytes Absolute: 0.3 10*3/uL (ref 0.1–1.0)
NEUTROS ABS: 2 10*3/uL (ref 1.7–7.7)
NEUTROS PCT: 47 % (ref 43–77)
Platelets: 258 10*3/uL (ref 150–400)
RBC: 4.4 MIL/uL (ref 3.87–5.11)
RDW: 15 % (ref 11.5–15.5)
WBC: 4.2 10*3/uL (ref 4.0–10.5)

## 2014-11-08 LAB — BASIC METABOLIC PANEL
BUN: 15 mg/dL (ref 6–23)
CO2: 23 mEq/L (ref 19–32)
Calcium: 9 mg/dL (ref 8.4–10.5)
Chloride: 108 mEq/L (ref 96–112)
Creat: 0.78 mg/dL (ref 0.50–1.10)
GLUCOSE: 99 mg/dL (ref 70–99)
Potassium: 4.3 mEq/L (ref 3.5–5.3)
Sodium: 139 mEq/L (ref 135–145)

## 2014-11-08 LAB — TSH: TSH: 0.627 u[IU]/mL (ref 0.350–4.500)

## 2014-11-08 NOTE — Progress Notes (Signed)
Subjective: Here today for a few weeks hx/o headache, chest pain, back pain. Of note she was hospitalized back in December for tubo-ovarian abscess.  She had been doing fine since the hospitalization up until last few weeks when she started having chest pains,  mid chest pains, headaches on the top of her head. The headaches seem to be consistent not letting up. The chest pain seems to be intermittent, sometimes worse with activity, sometimes lifting objects. 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 She denies any other recent trauma or injury. She notes feeling some sweaty or nauseous at time with chest pain but no short of breath no palpitations no edema. She has also been anemic leading up to the hospital visit in December.  She denies any urinary symptoms, no hematuria, no blood in the stool, no nausea vomiting or diarrhea no URI symptoms no vision or hearing changes no numbness or tingling or weakness.  She did take some ibuprofen for her symptoms.  She does endorse spicy foods. No other aggravating or relieving factors. No other complaint.  ROS as in subjective  Past Medical History  Diagnosis Date  . Anemia   . PID (pelvic inflammatory disease)   . Pregnancy induced hypertension   . Gestational diabetes     Objective: BP 112/80 mmHg  Pulse 77  Temp(Src) 97.5 F (36.4 C) (Oral)  Resp 16  Wt 193 lb (87.544 kg)  LMP 08/13/2014  General appearence: alert, no distress, WD/WN  HEENT: normocephalic, sclerae anicteric, TMs pearly, nares patent, no discharge or erythema, pharynx normal Oral cavity: MMM, no lesions Neck: supple, no lymphadenopathy, no thyromegaly, no masses Heart: RRR, normal S1, S2, no murmurs Tender over the sternum and and upper anterior chest wall tender mid back paraspinal bilaterally Lungs: CTA bilaterally, no wheezes, rhonchi, or rales Abdomen: +bs, soft, non tender, non distended, no masses, no hepatomegaly, no splenomegaly Pulses: 2+ symmetric, upper  and lower extremities, normal cap refill Ext: no edema   Adult ECG Report  Indication: chest pain  Rate: 69bpm  Rhythm: normal sinus rhythm  QRS Axis: -3 degrees  PR Interval: 190ms  QRS Duration: 82ms  QTc: 424ms  Conduction Disturbances: none  Other Abnormalities: none  Patient's cardiac risk factors are: none.  EKG comparison: none  Narrative Interpretation: normal EKG    Assessment: Encounter Diagnoses  Name Primary?  . Chest pain, unspecified chest pain type Yes  . Mid back pain   . Other headache syndrome   . Iron deficiency anemia     Plan: Labs today.  Reviewed EKG and symptoms. I suspect her symptoms are more musculoskeletal in nature regarding the chest and back pain. Headache etiology unclear.  Can use heat pad for chest wall pain, pending labs we'll give further recommendations regarding pain.  Advise if worse symptoms of chest pain shortness of breath or paresthesias to call 911 go to the hospital. Gave note for work.

## 2014-11-09 ENCOUNTER — Other Ambulatory Visit: Payer: Self-pay | Admitting: Medical

## 2014-11-09 MED ORDER — CYCLOBENZAPRINE HCL 10 MG PO TABS: 10.0000 mg | ORAL_TABLET | Freq: Every day | ORAL | Status: AC

## 2014-11-09 MED ORDER — NAPROXEN 375 MG PO TABS
375.0000 mg | ORAL_TABLET | Freq: Two times a day (BID) | ORAL | Status: AC
Start: 2014-11-09 — End: ?

## 2015-04-09 IMAGING — CR DG CHEST 2V
2 series · 2 of 2 positions shown · non-contrast
Comparison: None.

CLINICAL DATA: Chest pain and fever

EXAM:
CHEST  2 VIEW

[w chest pa]
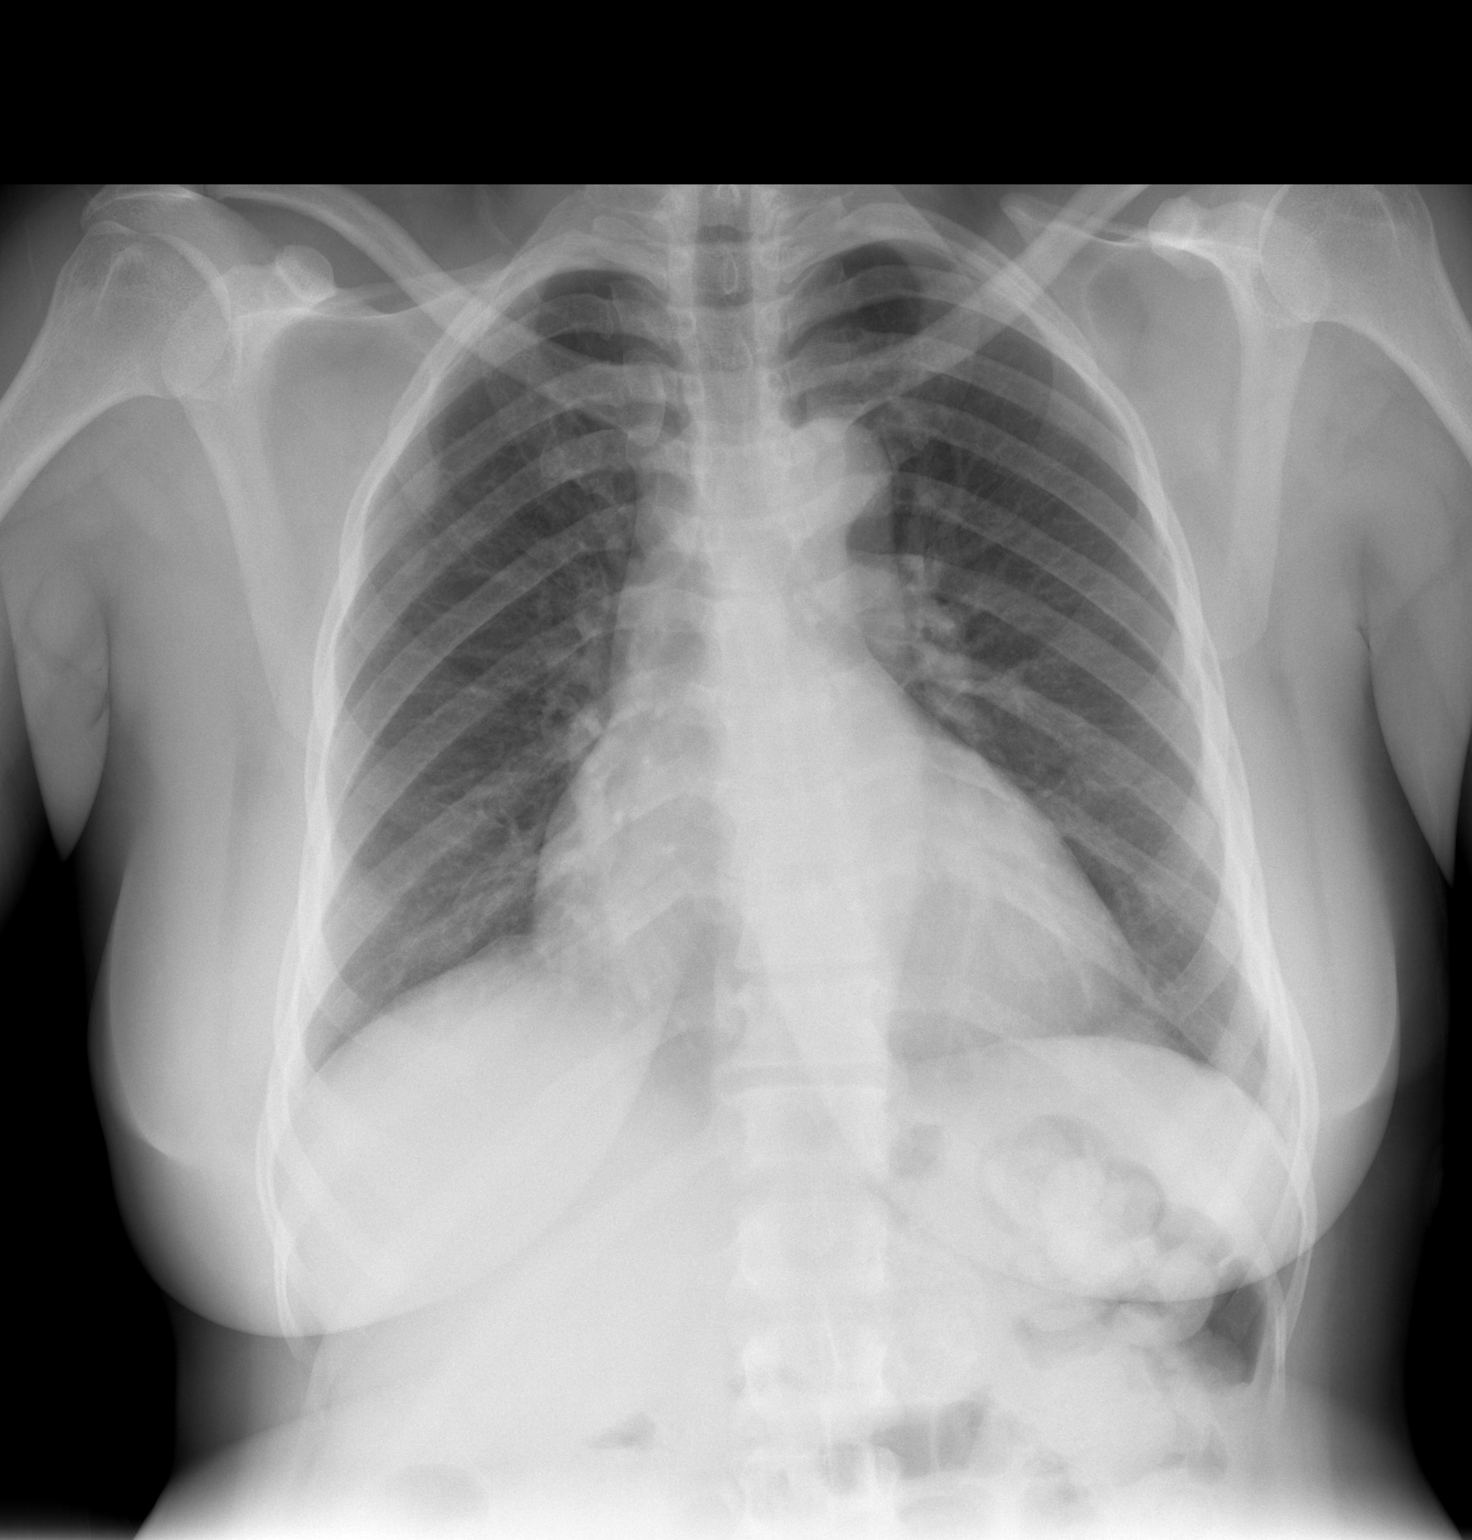

[w chest lat]
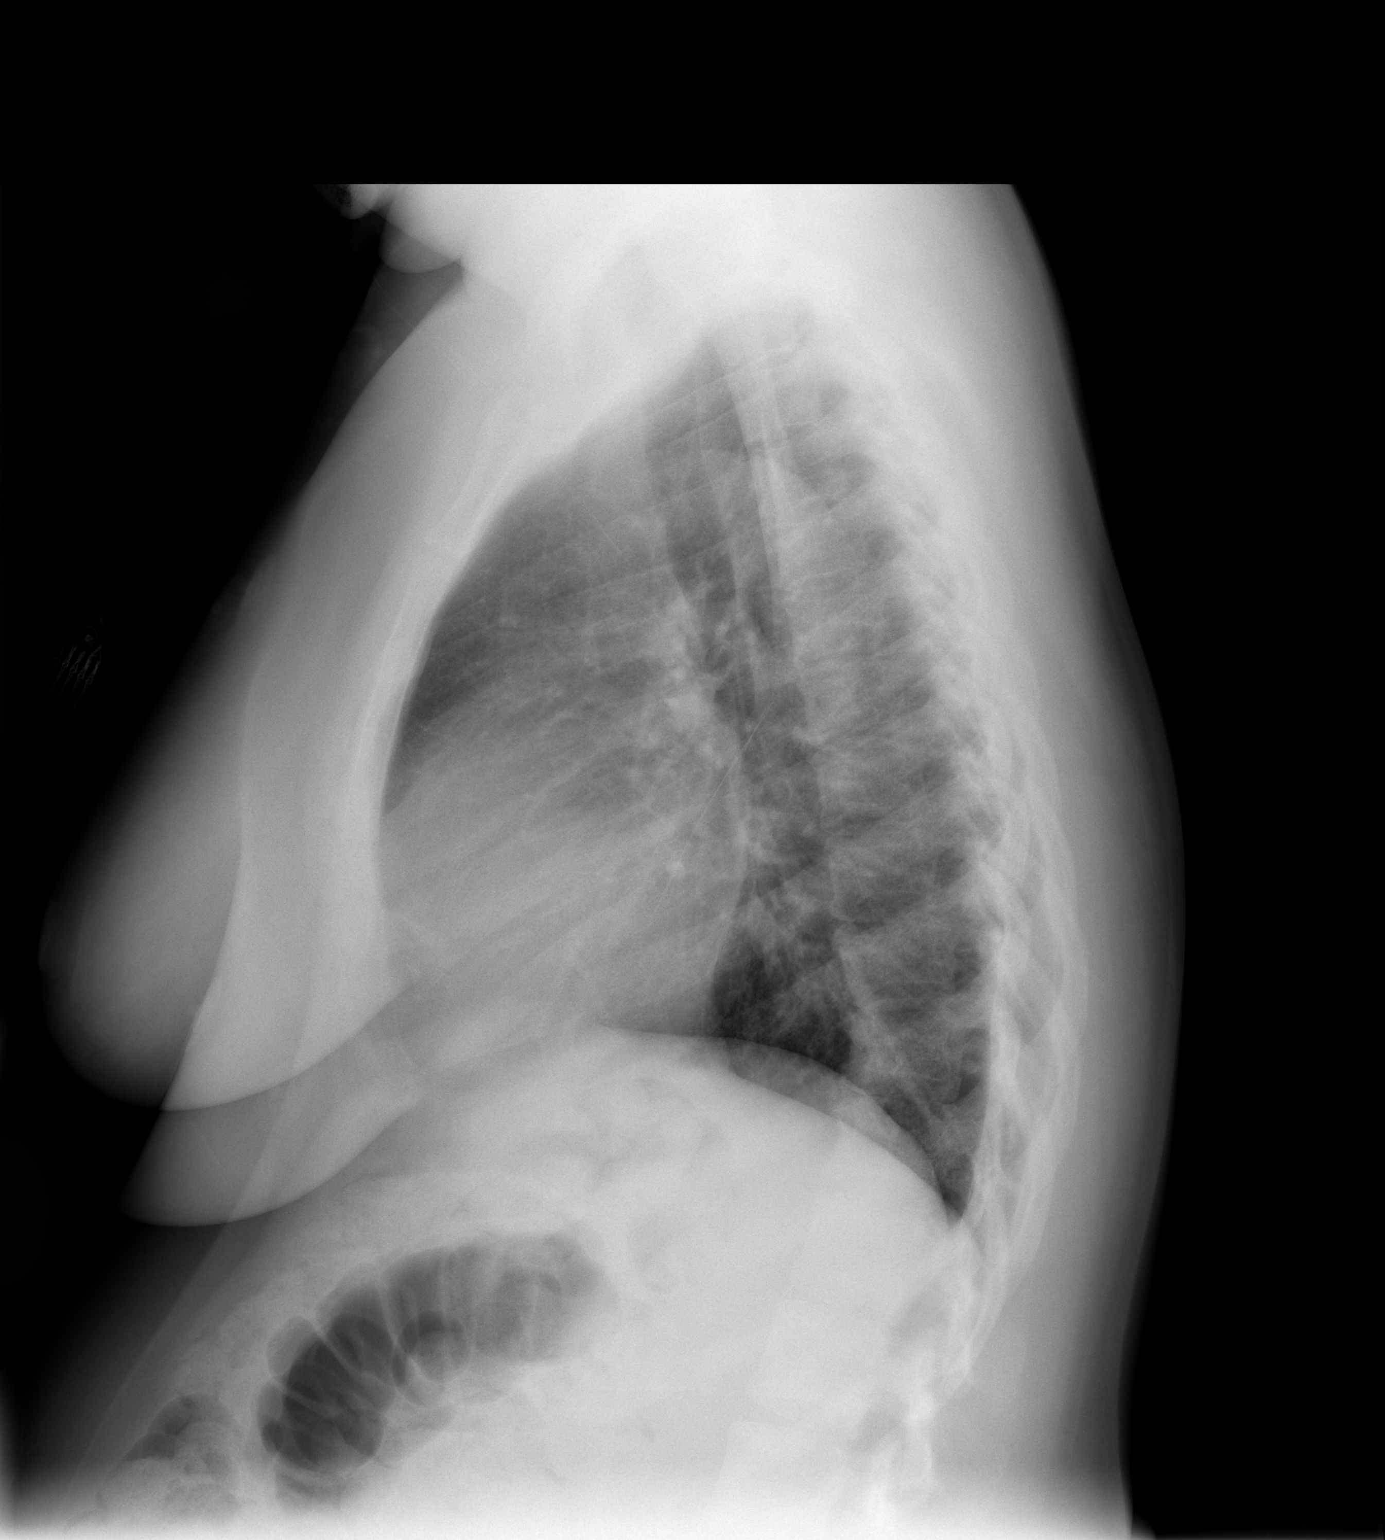

[2 of 2 positions shown; findings below may reference images not displayed]

FINDINGS: Lungs are clear. Heart size and pulmonary vascularity are normal. No
adenopathy. No pneumothorax. There is mid thoracic levoscoliosis.
IMPRESSION: No edema or consolidation.

## 2015-04-10 IMAGING — CT CT ANGIO CHEST
2 of 9 series · 19 of 46 positions shown · IV contrast (omnipaque)
Comparison: None

CLINICAL DATA: Chest pain and dizziness for 2-3 weeks, elevated
D-dimer, dyspnea, question pulmonary embolism

EXAM:
CT ANGIOGRAPHY CHEST WITH CONTRAST
TECHNIQUE: Multidetector CT imaging of the chest was performed using the
standard protocol during bolus administration of intravenous
contrast. Multiplanar CT image reconstructions including MIPs were
obtained to evaluate the vascular anatomy.
CONTRAST:  100mL OMNIPAQUE IOHEXOL 350 MG/ML SOLN

[Series 5: thins · axial · 0.72mm/px · z∈[-146,+78]mm · 16 of 254 slices shown]
[im 15/254  lung]
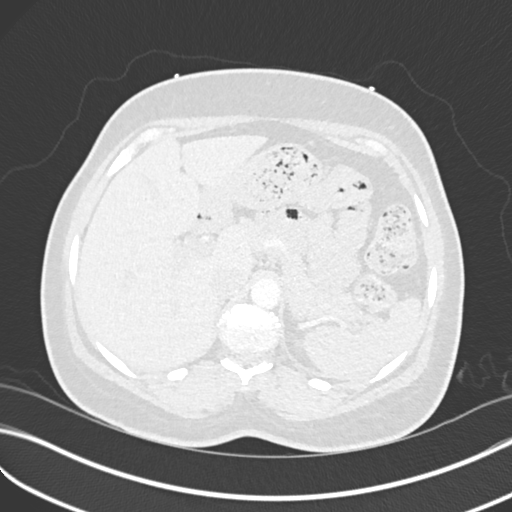
[im 29/254  soft-tissue]
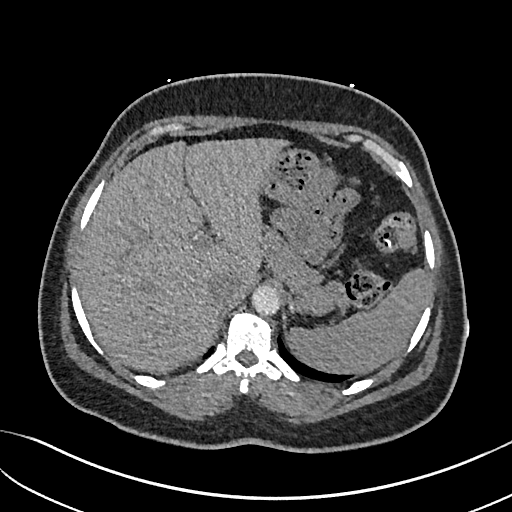
[im 43/254  lung]
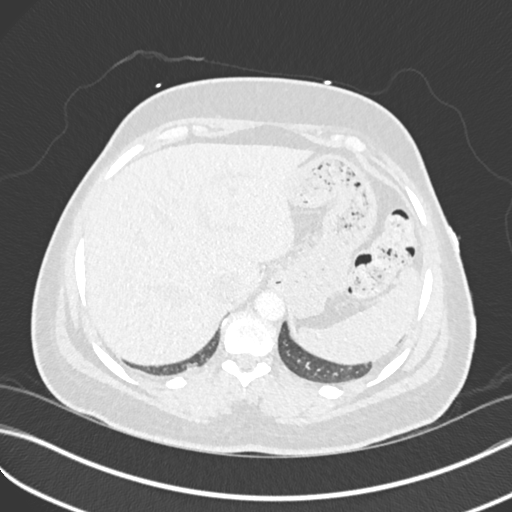
[im 57/254  soft-tissue]
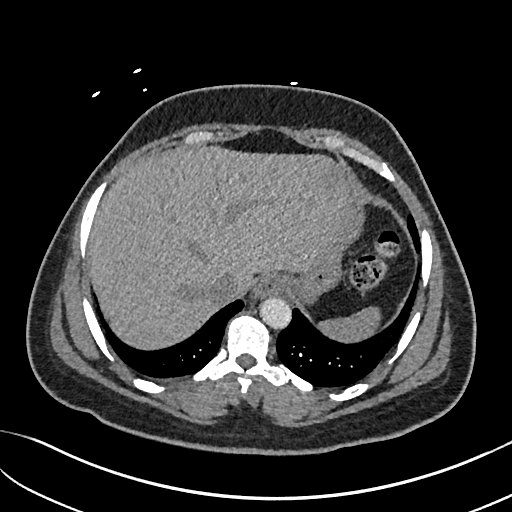
[im 71/254  lung]
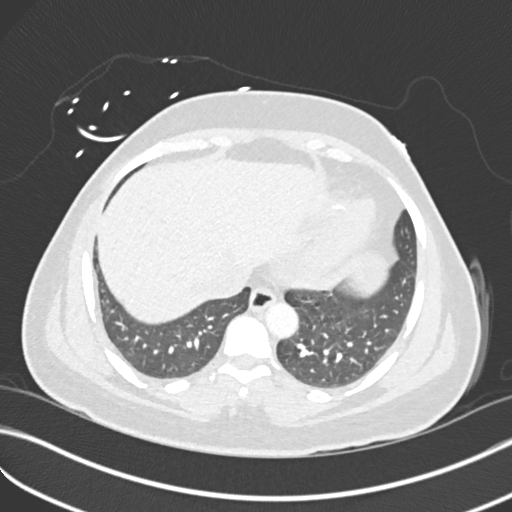
[im 85/254  soft-tissue]
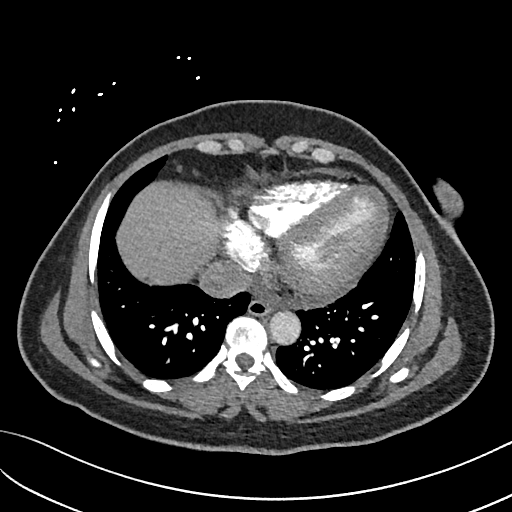
[im 99/254  lung]
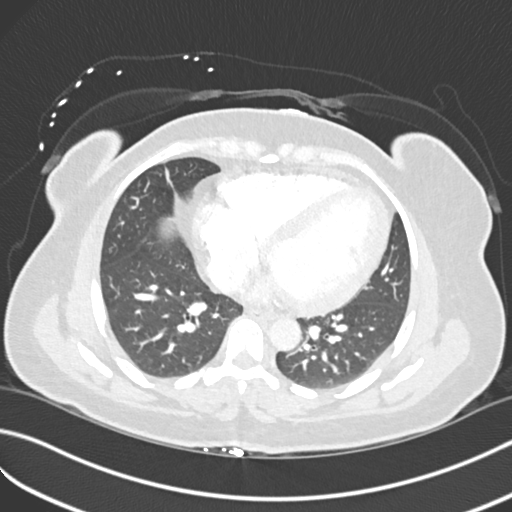
[im 113/254  soft-tissue]
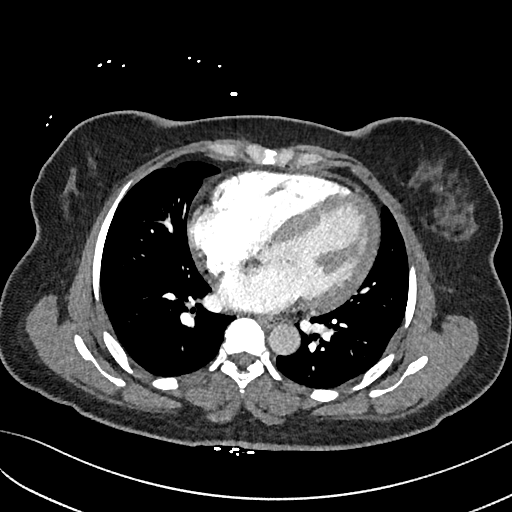
[im 141/254  lung]
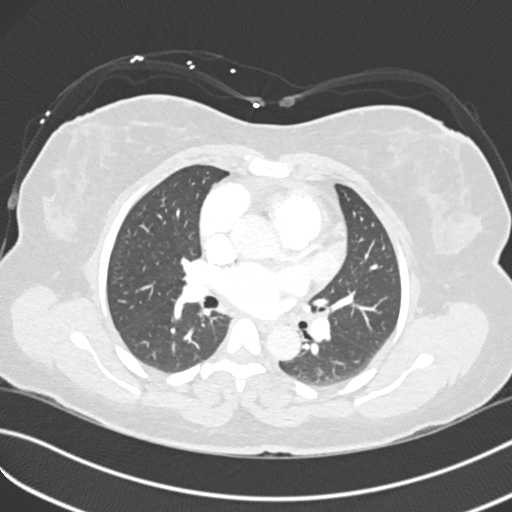
[im 155/254  soft-tissue]
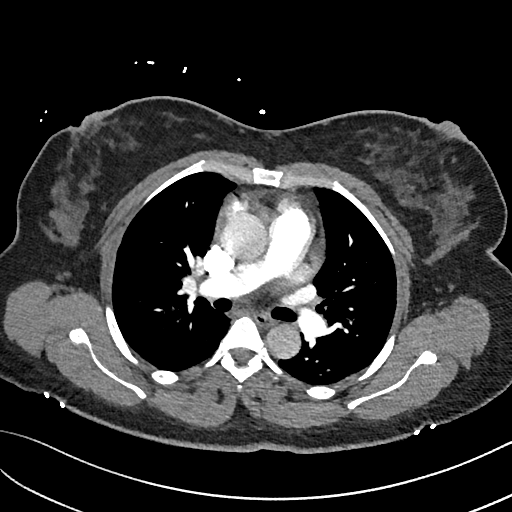
[im 169/254  lung]
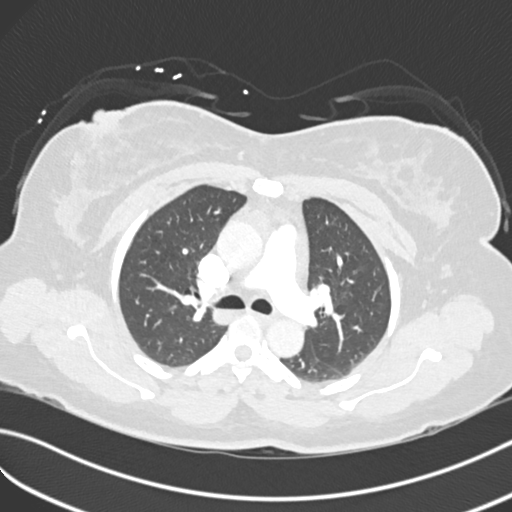
[im 183/254  soft-tissue]
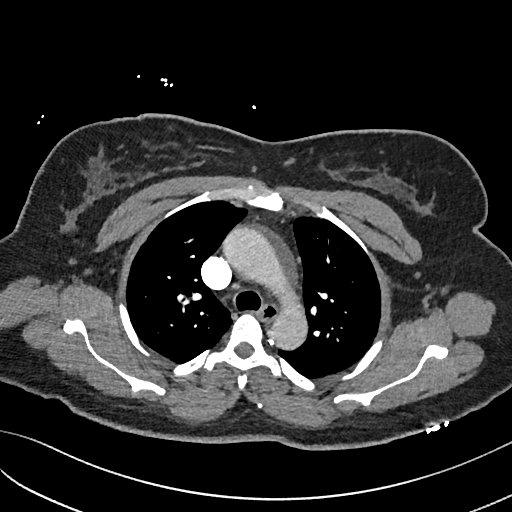
[im 197/254  lung]
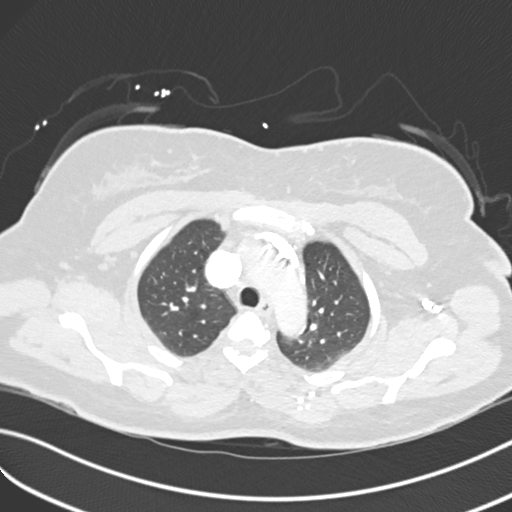
[im 211/254  soft-tissue]
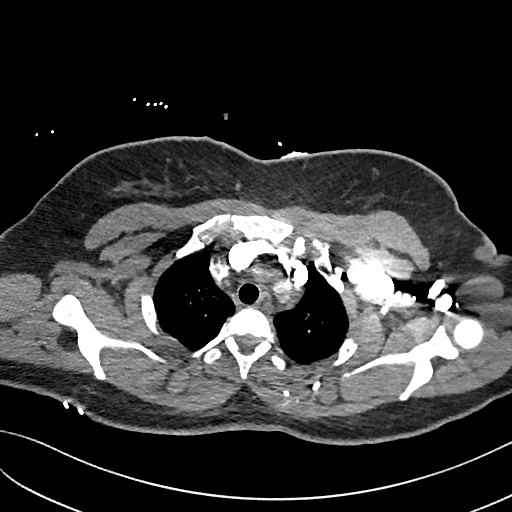
[im 225/254  lung]
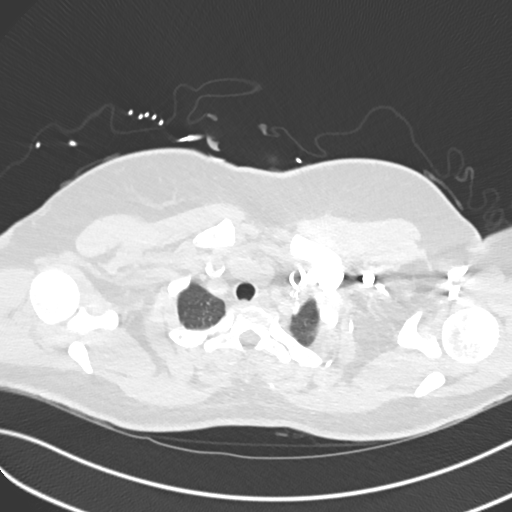
[im 239/254  soft-tissue]
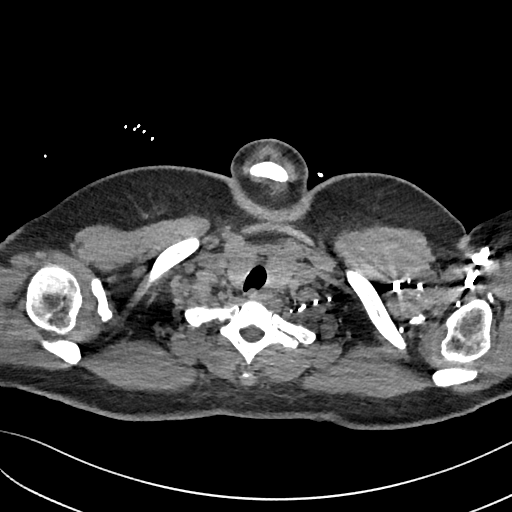

[Series 7: coronal mpr · coronal · 0.54mm/px · 3 of 151 slices shown]
[im 38/151  soft-tissue]
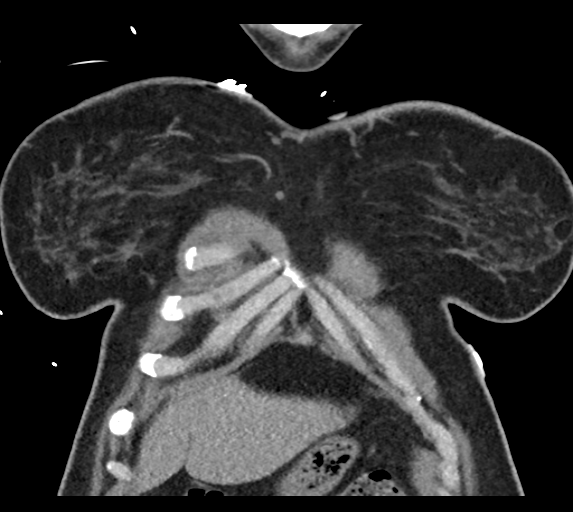
[im 76/151  soft-tissue]
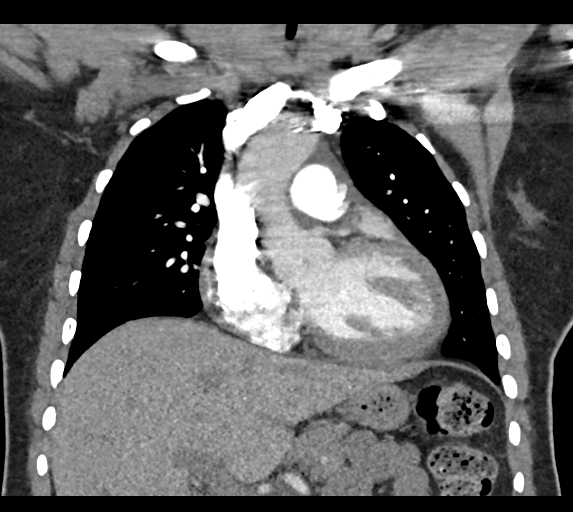
[im 113/151  soft-tissue]
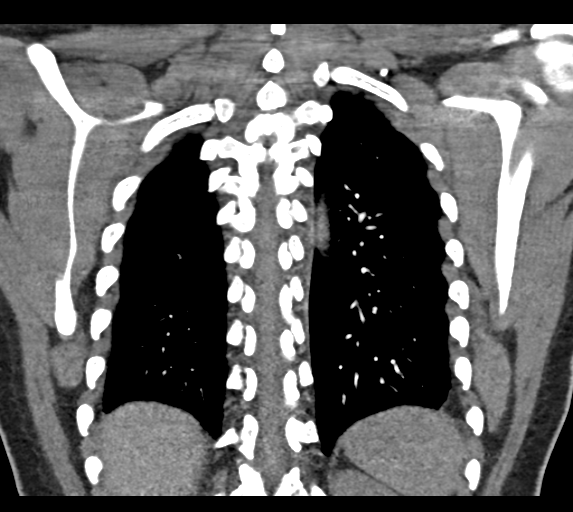

[19 of 46 positions shown; findings below may reference images not displayed]

FINDINGS: Aorta normal caliber without aneurysm or dissection.

Minimal pericardial effusion.

Visualized portion of upper abdomen normal appearance.

Pulmonary arteries patent.

No evidence of pulmonary embolism.

No thoracic adenopathy.

Linear subsegmental atelectasis at base of right middle lobe.

Remaining lungs clear.

No infiltrate, pleural effusion, pneumothorax, or acute osseous
findings.

Review of the MIP images confirms the above findings.
IMPRESSION: Minimal linear subsegmental atelectasis right middle lobe.

Minimal pericardial effusion.

No evidence of pulmonary embolism.

## 2015-08-20 ENCOUNTER — Telehealth: Payer: Self-pay

## 2015-08-20 ENCOUNTER — Encounter: Payer: No Typology Code available for payment source | Admitting: Medical

## 2015-08-20 DIAGNOSIS — Z0000 Encounter for general adult medical examination without abnormal findings: Secondary | ICD-10-CM

## 2015-08-20 NOTE — Telephone Encounter (Signed)
D.  Send no show fee.

## 2015-08-20 NOTE — Telephone Encounter (Signed)
This patient no showed for their appointment today.Which of the following is necessary for this patient.   A) No follow-up necessary   B) Follow-up urgent. Locate Patient Immediately.   C) Follow-up necessary. Contact patient and Schedule visit in ____ Days.   D) Follow-up Advised. Contact patient and Schedule visit in ____ Days. 

## 2015-08-22 ENCOUNTER — Other Ambulatory Visit: Payer: Self-pay

## 2015-08-22 ENCOUNTER — Encounter: Payer: Self-pay | Admitting: Medical

## 2015-08-22 ENCOUNTER — Ambulatory Visit (INDEPENDENT_AMBULATORY_CARE_PROVIDER_SITE_OTHER): Payer: PRIVATE HEALTH INSURANCE | Admitting: Obstetrics & Gynecology

## 2015-08-22 VITALS — BP 133/90 | HR 62 | Temp 98.0°F | Wt 200.9 lb

## 2015-08-22 DIAGNOSIS — N951 Menopausal and female climacteric states: Secondary | ICD-10-CM | POA: Diagnosis not present

## 2015-08-22 DIAGNOSIS — Z1231 Encounter for screening mammogram for malignant neoplasm of breast: Secondary | ICD-10-CM

## 2015-08-22 DIAGNOSIS — Z0000 Encounter for general adult medical examination without abnormal findings: Secondary | ICD-10-CM

## 2015-08-22 DIAGNOSIS — N898 Other specified noninflammatory disorders of vagina: Secondary | ICD-10-CM

## 2015-08-22 LAB — WET PREP, GENITAL
TRICH WET PREP: NONE SEEN
Yeast Wet Prep HPF POC: NONE SEEN

## 2015-08-22 MED ORDER — ESTRADIOL 0.5 MG PO TABS: 0.5000 mg | ORAL_TABLET | Freq: Every day | ORAL | Status: AC

## 2015-08-22 MED ORDER — METRONIDAZOLE 500 MG PO TABS: 500.0000 mg | ORAL_TABLET | Freq: Two times a day (BID) | ORAL | Status: DC

## 2015-08-22 NOTE — Telephone Encounter (Signed)
No show letter with fee sent °

## 2015-08-22 NOTE — Progress Notes (Signed)
   Subjective:    Patient ID: Kristin [name], Kristin Billowfemale    DOB: 12/05/1970, 44 y.o.   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 She tried OTC Norforms (deodorant suppositories). She also complains that she cannot sleep due to night sweats. She also has hot flashes and vaginal dryness that makes sex difficult.    Review of Systems She has never had a mammogram.    Objective:   Physical Exam  Obese BFNAD Breathing, conversing, and ambulating normally Abd- benign EG/vagina- frothy discharge c/w BV      Assessment & Plan:  Hot flashes/night sweats- rec estradiol 0.5 mg daily Vaginal itching with discharge c/w BV- treat with flagyl and sent wet prep She does not drink alcohol Schedule mammogram Rec [place] prn

## 2015-08-25 ENCOUNTER — Telehealth: Payer: Self-pay | Admitting: Medical

## 2015-08-25 NOTE — Telephone Encounter (Signed)
PT GOING TO CALL BACK, NEEDS TO SCHEDULE A CPE

## 2015-09-11 ENCOUNTER — Ambulatory Visit: Payer: PRIVATE HEALTH INSURANCE

## 2017-09-21 ENCOUNTER — Ambulatory Visit (INDEPENDENT_AMBULATORY_CARE_PROVIDER_SITE_OTHER): Payer: No Typology Code available for payment source | Admitting: General Practice

## 2017-09-21 DIAGNOSIS — N898 Other specified noninflammatory disorders of vagina: Secondary | ICD-10-CM | POA: Diagnosis not present

## 2017-09-21 DIAGNOSIS — Z113 Encounter for screening for infections with a predominantly sexual mode of transmission: Secondary | ICD-10-CM | POA: Diagnosis not present

## 2017-09-21 NOTE — Progress Notes (Signed)
Patient is here today for self swab. Patient reports vaginal itching but no odor. Patient instructed in self swab & specimen collected. Discussed with patient we will contact her with abnormal results. Patient verbalized understanding & had no questions

## 2017-09-22 LAB — CERVICOVAGINAL ANCILLARY ONLY
Bacterial vaginitis: NEGATIVE
CANDIDA VAGINITIS: NEGATIVE
TRICH (WINDOWPATH): NEGATIVE

## 2017-09-26 ENCOUNTER — Encounter: Payer: Self-pay | Admitting: General Practice

## 2017-09-26 NOTE — Progress Notes (Signed)
Patient came by office requesting results. Informed patient of normal results, but if she was still having itching she could use OTC monistat. Patient verbalized understanding & had no questions

## 2018-08-30 ENCOUNTER — Ambulatory Visit (INDEPENDENT_AMBULATORY_CARE_PROVIDER_SITE_OTHER): Payer: Self-pay

## 2018-08-30 DIAGNOSIS — Z113 Encounter for screening for infections with a predominantly sexual mode of transmission: Secondary | ICD-10-CM

## 2018-08-30 DIAGNOSIS — N760 Acute vaginitis: Secondary | ICD-10-CM

## 2018-08-30 DIAGNOSIS — N898 Other specified noninflammatory disorders of vagina: Secondary | ICD-10-CM

## 2018-08-30 DIAGNOSIS — B9689 Other specified bacterial agents as the cause of diseases classified elsewhere: Secondary | ICD-10-CM

## 2018-08-30 NOTE — Progress Notes (Addendum)
Pt here today due to c/o vaginal itching.  Pt explained how to obtain self swab and that we will call her with results if they are abnormal.  Pt stated understanding with no further questions.   Reviewed the note and agree with the plan of care.  Nolene BernheimERRI BURLESON, RN, MSN, NP-BC Nurse Practitioner, [facility], [facility] 08/30/2018 8:19 PM

## 2018-09-01 LAB — CERVICOVAGINAL ANCILLARY ONLY
Bacterial vaginitis: POSITIVE — AB
Candida vaginitis: NEGATIVE
Chlamydia: NEGATIVE
Neisseria Gonorrhea: NEGATIVE
TRICH (WINDOWPATH): NEGATIVE

## 2018-09-02 MED ORDER — METRONIDAZOLE 500 MG PO TABS: 500.0000 mg | ORAL_TABLET | Freq: Two times a day (BID) | ORAL | 0 refills | Status: AC

## 2018-09-02 NOTE — Addendum Note (Signed)
Addended by: Currie ParisBURLESON, Fed Ceci L on: 09/02/2018 09:13 AM   Modules accepted: Orders

## 2018-09-02 NOTE — Progress Notes (Signed)
BV noted on lab results.  Medication sent to her pharmacy and staff to call patient with the results.  Nolene BernheimERRI BURLESON, RN, MSN, NP-BC Nurse Practitioner, [facility], [facility] 09/02/2018 9:13 AM

## 2018-09-04 ENCOUNTER — Telehealth: Payer: Self-pay | Admitting: *Deleted

## 2018-09-04 NOTE — Telephone Encounter (Signed)
-----   Message from Currie Pariserri L Burleson, NP sent at 09/02/2018  9:12 AM EST ----- BV on lab results.  Will send medication to her pharmacy.  Please call client and let her know.

## 2018-09-04 NOTE — Telephone Encounter (Signed)
Called pt to inform her that she tested positive for BV.  Informed her that this is NOT and STD.  Advised her [facility] [address]  Pt verbalized understanding.

## 2019-06-14 ENCOUNTER — Encounter: Payer: Self-pay | Admitting: Registered"

## 2019-06-14 ENCOUNTER — Other Ambulatory Visit: Payer: Self-pay

## 2019-06-14 ENCOUNTER — Encounter: Payer: Self-pay | Attending: *Deleted | Admitting: Registered"

## 2019-06-14 DIAGNOSIS — E119 Type 2 diabetes mellitus without complications: Secondary | ICD-10-CM | POA: Insufficient documentation

## 2019-06-14 NOTE — Progress Notes (Signed)
Diabetes Self-Management Education  Visit Type: First/Initial  Appt. Start Time: 1535 Appt. End Time: 1650   06/14/2019  [name], identified by name and date of birth, is a 48 y.o. female with a diagnosis of Diabetes: Type 2.   ASSESSMENT  Last menstrual period 08/13/2014. There is no height or weight on file to calculate BMI.   Pt has new Dx T2DM, Hx of GDM 14 yrs ago.  Pt source of carbohydrates are from traditional foods such as casava, fufu, and rice.  Pt states she is interested in checking BG to monitor her progress in brining down her blood sugar.   Fatigue: pt states sometimes she gets headaches with her fatigue and takes Tylenol. Per chart in 2015 pt had Rx for Iron, but states she was never given it when picking up medications at pharmacy. Per medical records the 2015 iron lab was 37 (L) (42-145 ug/dL) Patient may also be low in vitamin D d/t lack of time outside. Pt states she runs 10-15 min before work, but that is about all the time she spends outside.  Monitor provided: [employer] Flex Lot #: [id number] x Exp: 01/11/20 BG: 110 mg/dL  Diabetes Self-Management Education - 06/14/19 1546      Visit Information   Visit Type  First/Initial      Initial Visit   Diabetes Type  Type 2    Are you currently following a meal plan?  No    Are you taking your medications as prescribed?  Not on Medications    Date Diagnosed  04/12/2019      Health Coping   How would you rate your overall health?  Poor      Psychosocial Assessment   Patient Belief/Attitude about Diabetes  Motivated to manage diabetes    How often do you need to have someone help you when you read instructions, pamphlets, or other written materials from your doctor or pharmacy?  5 - Always    What is the last grade level you completed in school?  6th      Complications   Last HgB A1C per patient/outside source  6.9 %    How often do you check your blood sugar?  0 times/day (not testing)    Have you  had a dilated eye exam in the past 12 months?  No    Have you had a dental exam in the past 12 months?  Yes    Are you checking your feet?  No      Dietary Intake   Breakfast  eggs OR salad OR avocado, bread, coffee with milk    Snack (morning)  none OR fruit    Lunch  none OR rice, soup with beef, lamb okra or PB fufu OR casava    Dinner  fried meat and same as above (fish ~2x week)    Snack (evening)  none    Beverage(s)  tea, water, coffee      Exercise   Exercise Type  ADL's    How many days per week to you exercise?  0    How many minutes per day do you exercise?  0    Total minutes per week of exercise  0      Patient Education   Previous Diabetes Education  Yes (please comment)   GDM 14 yrs ago   Nutrition management   Role of diet in the treatment of diabetes and the relationship between the three main macronutrients and blood glucose  level;Carbohydrate counting;Reviewed blood glucose goals for pre and post meals and how to evaluate the patients' food intake on their blood glucose level.    Physical activity and exercise   Role of exercise on diabetes management, blood pressure control and cardiac health.    Monitoring  Identified appropriate SMBG and/or A1C goals.;Other (comment)   taught use of meter   Personal strategies to promote health  Review risk of smoking and offered smoking cessation      Individualized Goals (developed by patient)   Nutrition  General guidelines for healthy choices and portions discussed    Monitoring   test my blood glucose as discussed      Outcomes   Expected Outcomes  Demonstrated interest in learning. Expect positive outcomes    Future DMSE  4-6 wks    Program Status  Completed       Individualized Plan for Diabetes Self-Management Training:   Learning Objective:  Patient will have a greater understanding of diabetes self-management. Patient education plan is to attend individual and/or group sessions per assessed needs and  concerns.   Patient Instructions  For fatigue (tiredness) ask your doctor about getting your Iron and Vitamin D checked. Try eating liver 1 time per week to see if it helps with your energy level.  Because you don't get in the sun much, it wouldn't hurt to take a vitamin D supplement, look for one with a dose of 2,000 units.    Aim to eat balanced meals and snacks and keep the foods that belong in the starch and fruit categories to 2-3 servings for a meal When eating fruit also eat protein to help balance.  Call your insurance company to see if they cover supplies for the meter you received today. If so, ask what the co-pay will be. If not you can by a meter at the drug store without a prescription.  Checking your blood sugar will be a way for you to see if you are improving your blood sugar control. Use handout for target ranges and how to calculate average.  Expected Outcomes:  Demonstrated interest in learning. Expect positive outcomes  Education material provided: A1C conversion sheet, Planning Healthy Meals (novonordisk foldout)  If problems or questions, patient to contact team via:  Phone  Future DSME appointment: 4-6 wks

## 2019-06-14 NOTE — Patient Instructions (Addendum)
For fatigue (tiredness) ask your doctor about getting your Iron and Vitamin D checked. Try eating liver 1 time per week to see if it helps with your energy level.  Because you don't get in the sun much, it wouldn't hurt to take a vitamin D supplement, look for one with a dose of 2,000 units.    Aim to eat balanced meals and snacks and keep the foods that belong in the starch and fruit categories to 2-3 servings for a meal When eating fruit also eat protein to help balance.  Call your insurance company to see if they cover supplies for the meter you received today. If so, ask what the co-pay will be. If not you can by a meter at the drug store without a prescription.  Checking your blood sugar will be a way for you to see if you are improving your blood sugar control. Use handout for target ranges and how to calculate average.

## 2019-07-26 ENCOUNTER — Ambulatory Visit: Payer: Self-pay | Admitting: Registered"

## 2021-07-09 LAB — EXTERNAL GENERIC LAB PROCEDURE: COLOGUARD: NEGATIVE

## 2021-07-09 LAB — COLOGUARD: COLOGUARD: NEGATIVE

## 2023-08-15 ENCOUNTER — Encounter: Payer: Self-pay | Admitting: Nurse Practitioner

## 2024-04-24 ENCOUNTER — Emergency Department (HOSPITAL_COMMUNITY)
Admission: EM | Admit: 2024-04-24 | Discharge: 2024-04-25 | Disposition: A | Discharge status: Home / Self Care | Payer: Self-pay | Attending: Emergency Medicine | Admitting: Emergency Medicine

## 2024-04-24 DIAGNOSIS — R45851 Suicidal ideations: Secondary | ICD-10-CM | POA: Insufficient documentation

## 2024-04-24 DIAGNOSIS — R45850 Homicidal ideations: Secondary | ICD-10-CM | POA: Insufficient documentation

## 2024-04-24 DIAGNOSIS — F419 Anxiety disorder, unspecified: Secondary | ICD-10-CM | POA: Insufficient documentation

## 2024-04-24 LAB — CBC
HCT: 43.2 % (ref 36.0–46.0)
Hemoglobin: 13.6 g/dL (ref 12.0–15.0)
MCH: 26.5 pg (ref 26.0–34.0)
MCHC: 31.5 g/dL (ref 30.0–36.0)
MCV: 84 fL (ref 80.0–100.0)
Platelets: 204 K/uL (ref 150–400)
RBC: 5.14 MIL/uL — ABNORMAL HIGH (ref 3.87–5.11)
RDW: 14.6 % (ref 11.5–15.5)
WBC: 7.1 K/uL (ref 4.0–10.5)
nRBC: 0 % (ref 0.0–0.2)

## 2024-04-24 LAB — COMPREHENSIVE METABOLIC PANEL WITH GFR
ALT: 19 U/L (ref 0–44)
AST: 26 U/L (ref 15–41)
Albumin: 4.1 g/dL (ref 3.5–5.0)
Alkaline Phosphatase: 98 U/L (ref 38–126)
Anion gap: 11 (ref 5–15)
BUN: 15 mg/dL (ref 6–20)
CO2: 20 mmol/L — ABNORMAL LOW (ref 22–32)
Calcium: 9.2 mg/dL (ref 8.9–10.3)
Chloride: 107 mmol/L (ref 98–111)
Creatinine, Ser: 0.63 mg/dL (ref 0.44–1.00)
GFR, Estimated: >60 mL/min (ref >60)
Glucose, Bld: 140 mg/dL — ABNORMAL HIGH (ref 70–99)
Potassium: 3.9 mmol/L (ref 3.5–5.1)
Sodium: 138 mmol/L (ref 135–145)
Total Bilirubin: 1 mg/dL (ref 0.0–1.2)
Total Protein: 8.1 g/dL (ref 6.5–8.1)

## 2024-04-24 LAB — RAPID URINE DRUG SCREEN, HOSP PERFORMED
Amphetamines: NOT DETECTED
Barbiturates: NOT DETECTED
Benzodiazepines: NOT DETECTED
Cocaine: NOT DETECTED
Opiates: NOT DETECTED
Tetrahydrocannabinol: NOT DETECTED

## 2024-04-24 LAB — ETHANOL: Alcohol, Ethyl (B): <15 mg/dL (ref <15)

## 2024-04-24 NOTE — ED Provider Notes (Signed)
 [facility]PART[facility] AT [facility] Provider Note   CSN: [id number] Arrival date & time: 04/24/24  1952     Patient presents with: No chief complaint on file.   Kristin [name] is a 53 y.o. female.  Presents today via EMS initially for an anxiety attack patient initially refusing to talk or answer questions.  Patient later endorsing suicidal ideations and wanting to kill her husband.  Patient refusing to speak during my assessment, however shakes her head no when asked if she has any medical complaints.  Patient does shake her head yes when asking if she wants to hurt herself or anybody else.   HPI     Prior to Admission medications   Medication Sig Start Date End Date Taking? Authorizing Provider  cetirizine (ZYRTEC) 10 MG tablet Take 10 mg by mouth daily. Patient states she ran out today, but plans to refill    [provider]  cyclobenzaprine  (FLEXERIL ) 10 MG tablet Take 1 tablet (10 mg total) by mouth at bedtime. Patient not taking: Reported on 08/22/2015 11/09/14   Tysinger, Alm RAMAN, PA-C  docusate sodium  (COLACE) 100 MG capsule Take 1 capsule (100 mg total) by mouth 2 (two) times daily as needed. Patient not taking: Reported on 08/22/2015 07/09/14   Constant, Peggy, MD  estradiol  (ESTRACE ) 0.5 MG tablet Take 1 tablet (0.5 mg total) by mouth daily. 08/22/15   Starla Harland BROCKS, MD  estrogens , conjugated, (PREMARIN ) 0.625 MG tablet Take 1 tablet (0.625 mg total) by mouth daily. Take daily for 21 days then do not take for 7 days. Patient not taking: Reported on 08/22/2015 10/07/14   Corene Coy, MD  ferrous gluconate  (FERGON) 324 MG tablet 1 tablet 2-3 times daily with orange juice Patient not taking: Reported on 08/22/2015 06/11/14   Tysinger, Alm RAMAN, PA-C  metroNIDAZOLE  (FLAGYL ) 500 MG tablet Take 1 tablet (500 mg total) by mouth 2 (two) times daily. No alcohol while taking this medication 09/02/18   Sid Veva CROME, NP  naproxen  (NAPROSYN ) 375 MG  tablet Take 1 tablet (375 mg total) by mouth 2 (two) times daily with a meal. Patient not taking: Reported on 08/22/2015 11/09/14   Tysinger, Alm RAMAN, PA-C    Allergies: Zosyn  [piperacillin  sod-tazobactam so]    Review of Systems  Psychiatric/Behavioral:  Positive for suicidal ideas.     Updated Vital Signs BP (!) 153/103   Pulse 78   Temp 99.8 F (37.7 C)   Resp (!) 22   LMP 08/13/2014   SpO2 98%   Physical Exam Vitals and nursing note reviewed.  Constitutional:      General: She is not in acute distress.    Appearance: Normal appearance. She is well-developed. She is not ill-appearing.  HENT:     Head: Normocephalic and atraumatic.     Right Ear: External ear normal.     Left Ear: External ear normal.     Nose: Nose normal.  Eyes:     Extraocular Movements: Extraocular movements intact.     Conjunctiva/sclera: Conjunctivae normal.  Cardiovascular:     Rate and Rhythm: Normal rate and regular rhythm.     Pulses: Normal pulses.     Heart sounds: Normal heart sounds. No murmur heard. Pulmonary:     Effort: Pulmonary effort is normal. No respiratory distress.     Breath sounds: Normal breath sounds.  Musculoskeletal:     Cervical back: Neck supple.  Skin:    General: Skin is warm and dry.  Capillary Refill: Capillary refill takes less than 2 seconds.  Neurological:     Mental Status: She is alert.  Psychiatric:        Mood and Affect: Mood normal. Affect is tearful.        Behavior: Behavior is cooperative.     (all labs ordered are listed, but only abnormal results are displayed) Labs Reviewed  COMPREHENSIVE METABOLIC PANEL WITH GFR - Abnormal; Notable for the following components:      Result Value   CO2 20 (*)    Glucose, Bld 140 (*)    All other components within normal limits  CBC - Abnormal; Notable for the following components:   RBC 5.14 (*)    All other components within normal limits  ETHANOL  RAPID URINE DRUG SCREEN, HOSP PERFORMED     EKG: None  Radiology: No results found.   Procedures   Medications Ordered in the ED - No data to display                                  Medical Decision Making Amount and/or Complexity of Data Reviewed Labs: ordered.   This patient presents to the ED for concern of SI/HI, this involves an extensive number of treatment options, and is a complaint that carries with it a high risk of complications and morbidity.  The differential diagnosis includes psychosis, suicidal ideation,   Additional history obtained:  Additional history obtained from EMR External records from outside source obtained and reviewed including Care Everywhere   Lab Tests:  I Ordered, and personally interpreted labs.  The pertinent results include: Mildly decreased CO2 at 20, CBC unremarkable, alcohol less than 15  Cardiac Monitoring: / EKG:  The patient was maintained on a cardiac monitor.  I personally viewed and interpreted the cardiac monitored which showed an underlying rhythm of: Sinus rhythm, LAD   Test / Admission - Considered:  Patient's disposition will be determined after TTS consult     Final diagnoses:  Suicidal ideation  Homicidal ideation    ED Discharge Orders     None          Francis Ileana SAILOR, PA-C 04/24/24 2236    Franklyn Sid SAILOR, MD 04/25/24 [phone]

## 2024-04-24 NOTE — ED Triage Notes (Signed)
 Patient tearful and refuses to speak at triage.

## 2024-04-24 NOTE — BH Assessment (Signed)
 Clinician spoke to with IRIS to complete pt's TTS assessment. Clinician provided pt's name, MRN, location, age, room number and provider's name Secure message completed.    Iris coordinator to update secure chat when assessment time and provider are assigned.   Redmond Pulling, MS, [facility], [facility] Triage Specialist [phone]

## 2024-04-24 NOTE — ED Triage Notes (Signed)
 Patient BIB EMS for anxiety attack from home. Had an argument with her husband. Refuses to talk and answer questions. Not combative or aggressive per EMS.

## 2024-04-24 NOTE — ED Notes (Signed)
 Pt Brother Yarah Fuente [phone] will come and pick up patient if she is discharged

## 2024-04-24 NOTE — ED Triage Notes (Signed)
 Patient states she is having SI and patient states she wants to kill her husband. JRPRN

## 2024-04-25 DIAGNOSIS — R45851 Suicidal ideations: Secondary | ICD-10-CM

## 2024-04-25 DIAGNOSIS — F4322 Adjustment disorder with anxiety: Secondary | ICD-10-CM

## 2024-04-25 MED ORDER — HYDROXYZINE HCL 25 MG PO TABS
25.0000 mg | ORAL_TABLET | Freq: Three times a day (TID) | ORAL | Status: DC | PRN
  Administered 2024-04-25 (×2): 25 mg via ORAL
  Filled 2024-04-25: qty 1

## 2024-04-25 MED ORDER — HYDROXYZINE HCL 25 MG PO TABS: 25.0000 mg | ORAL_TABLET | Freq: Four times a day (QID) | ORAL | 0 refills | Status: AC | PRN

## 2024-04-25 NOTE — ED Notes (Addendum)
 [facility] called pts husband for collateral information. Pts husband's phone went straight to voicemail. A HIPAA compliant message was left to return the call.   Pts husband Ava Pagoda returned [facility]'s call to provide collateral information. Per pts husband who was at [facility] this morning visiting pt, he and pt are employed at the same company, [employer]. Pts husband is a Merchandiser, retail and speaks with other employees as part of his job. Yesterday pt observed her husband with a woman at their company. Per pts husband, pt believed that there was something going on between her husband and the woman. Pt went home at end of her day and was still upset. Pt called her husband's brother and told him what had happened. The brother tried calming pt down but she remained upset. Pts husband denies that there was any inappropriate interaction with the woman at his job.  Chesley Holt, [facility]  04/25/24

## 2024-04-25 NOTE — BH Assessment (Signed)
 IRIS to assess pt at 0730.     Jackson JONETTA Broach, MS, [facility], [facility] Triage Specialist [phone]

## 2024-04-25 NOTE — Discharge Instructions (Addendum)
 Follow-up with a mental health provider for further treatment.  You can take the hydroxyzine  as needed for anxiety.

## 2024-04-25 NOTE — Consult Note (Signed)
 [facility] Consult Note  Patient Name: Kristin [name] MRN: [id number] DOB: 1970-11-27 DATE OF Consult: 04/25/2024  PRIMARY PSYCHIATRIC DIAGNOSES  1.  Adjustment disorder with anxiety  RECOMMENDATIONS  Recommendations: Medication recommendations: Hydroxyzine  25 mg p.o. 3 times daily as needed for anxiety Non-Medication/therapeutic recommendations: Supportive care, discharge to outpatient resources, please provide referral to psychiatrist and therapist outpatient Is inpatient psychiatric hospitalization recommended for this patient? No (Explain why): Low risk of suicide or homicide, not psychotic Is another care setting recommended for this patient? (examples may include Crisis Stabilization Unit, Residential/Recovery Treatment, ALF/SNF, Memory Care Unit)  No (Explain why): Low risk of suicide or homicide, not psychotic From a psychiatric perspective, is this patient appropriate for discharge to an outpatient setting/resource or other less restrictive environment for continued care?  Yes (Explain why): Low risk of suicide or homicide, not psychotic Follow-Up Telepsychiatry C/L services: We will sign off for now. Please re-consult our service if needed for any concerning changes in the patient's condition, discharge planning, or questions. Communication: Treatment team members (and family members if applicable) who were involved in treatment/care discussions and planning, and with whom we spoke or engaged with via secure text/chat, include the following: M. Reynolds, RN  Thank you for involving us  in the care of this patient. If you have any additional questions or concerns, please call [phone] and ask for me or the provider on-call.  TELEPSYCHIATRY ATTESTATION & CONSENT  As the provider for this telehealth consult, I attest that I verified the patient's identity using two separate identifiers, introduced myself to the patient, provided my credentials, disclosed my location, and performed this  encounter via a HIPAA-compliant, real-time, face-to-face, two-way, interactive audio and video platform and with the full consent and agreement of the patient (or guardian as applicable.)  Patient physical location: [facility]. Telehealth provider physical location: home office in state of [place].  Video start time: 0900 AM (Central Time) Video end time: 0920 AM (Central Time)  IDENTIFYING DATA  Kristin [name] is a 53 y.o. year-old female for whom a psychiatric consultation has been ordered by the primary provider. The patient was identified using two separate identifiers.  CHIEF COMPLAINT/REASON FOR CONSULT  Anxiety attack,  Suicidal and homicidal ideation  HISTORY OF PRESENT ILLNESS (HPI)  Psych consult requested for evaluation of 53 year old female presented to the ED with complaint of anxiety/panic attack in the context of argument with her husband.  Reportedly later expressed suicidal ideation and homicidal ideation toward her husband.  No past psychiatric history.  During evaluation patient is alert oriented, cooperative and pleasant, affect is reactive.  Categorically denies expressing suicidal or homicidal ideation toward her husband.  Reports she was having an anxiety attack after she found out that her husband has been having an affair and her daughter was scared and called the ambulance.  Patient reports she is a mother of 5, states  I am a mother if I kill myself or my husband what is going to happen to my children?  Believes her statements were misunderstood or taken out of context.  Patient denies symptoms suggestive of depression PTSD mania or psychosis.  Reports anxiety is manageable.  Denies suicide homicide ideation or thoughts of harm.  Attempted to reach her husband Oumar at [phone], call went to voicemail   PAST PSYCHIATRIC HISTORY   Otherwise as per HPI above.  PAST MEDICAL HISTORY  Past Medical History:  Diagnosis Date   Anemia    Gestational diabetes    PID  (pelvic  inflammatory disease)    Pregnancy induced hypertension      HOME MEDICATIONS  PTA Medications  Medication Sig   metFORMIN (GLUCOPHAGE) 500 MG tablet Take 500 mg by mouth.   ferrous gluconate  (FERGON) 324 MG tablet 1 tablet 2-3 times daily with orange juice (Patient not taking: Reported on 08/22/2015)   docusate sodium  (COLACE) 100 MG capsule Take 1 capsule (100 mg total) by mouth 2 (two) times daily as needed. (Patient not taking: Reported on 08/22/2015)   estrogens , conjugated, (PREMARIN ) 0.625 MG tablet Take 1 tablet (0.625 mg total) by mouth daily. Take daily for 21 days then do not take for 7 days. (Patient not taking: Reported on 08/22/2015)   cyclobenzaprine  (FLEXERIL ) 10 MG tablet Take 1 tablet (10 mg total) by mouth at bedtime. (Patient not taking: Reported on 08/22/2015)   naproxen  (NAPROSYN ) 375 MG tablet Take 1 tablet (375 mg total) by mouth 2 (two) times daily with a meal. (Patient not taking: Reported on 08/22/2015)   estradiol  (ESTRACE ) 0.5 MG tablet Take 1 tablet (0.5 mg total) by mouth daily.   metroNIDAZOLE  (FLAGYL ) 500 MG tablet Take 1 tablet (500 mg total) by mouth 2 (two) times daily. No alcohol while taking this medication   cetirizine (ZYRTEC) 10 MG tablet Take 10 mg by mouth daily. Patient states she ran out today, but plans to refill     ALLERGIES  Allergies  Allergen Reactions   Zosyn  [Piperacillin  Sod-Tazobactam So] Itching    Pt reports itching when receiving zosyn     SOCIAL & SUBSTANCE USE HISTORY  Social History   Socioeconomic History   Marital status: Married    Spouse name: Not on file   Number of children: Not on file   Years of education: Not on file   Highest education level: Not on file  Occupational History   Not on file  Tobacco Use   Smoking status: Never   Smokeless tobacco: Never  Substance and Sexual Activity   Alcohol use: No   Drug use: No   Sexual activity: Yes    Birth control/protection: Surgical  Other Topics Concern   Not  on file  Social History Narrative   Not on file   Social Drivers of Health   Financial Resource Strain: Low Risk  (02/17/2023)   Received from [facility]   Overall Financial Resource Strain (CARDIA)    Difficulty of Paying Living Expenses: Not hard at all  Food Insecurity: No Food Insecurity (02/17/2023)   Received from [facility]   Hunger Vital Sign    Within the past 12 months, you worried that your food would run out before you got the money to buy more.: Never true    Within the past 12 months, the food you bought just didn't last and you didn't have money to get more.: Never true  Transportation Needs: No Transportation Needs (02/17/2023)   Received from [facility] - Transportation    Lack of Transportation (Medical): No    Lack of Transportation (Non-Medical): No  Physical Activity: Not on file  Stress: Not on file  Social Connections: Unknown (01/26/2022)   Received from [facility]   Social Network    Social Network: Not on file   Social History   Tobacco Use  Smoking Status Never  Smokeless Tobacco Never   Social History   Substance and Sexual Activity  Alcohol Use No   Social History   Substance and Sexual Activity  Drug Use No  Additional pertinent information .  FAMILY HISTORY  Family History  Problem Relation Age of Onset   Other Neg Hx    Diabetes Father    Hypertension Father    Family Psychiatric History (if known):  denies  MENTAL STATUS EXAM (MSE)  Mental Status Exam: General Appearance: Well Groomed  Orientation:  Full (Time, Place, and Person)  Memory:  Immediate;   Fair Recent;   Fair Remote;   Fair  Concentration:  Concentration: Fair and Attention Span: Fair  Recall:  Fair  Attention  Fair  Eye Contact:  Fair  Speech:  Clear and Coherent  Language:  Fair  Volume:  Normal  Mood: good  Affect:  Appropriate  Thought Process:  Coherent  Thought Content:  Negative  Suicidal Thoughts:  No  Homicidal Thoughts:   No  Judgement:  Fair  Insight:  Fair  Psychomotor Activity:  Normal  Akathisia:  No  Fund of Knowledge:  Fair    Assets:  Desire for Improvement Housing Resilience Social Support Transportation Vocational/Educational  Cognition:  WNL  ADL's:  Intact  AIMS (if indicated):       VITALS  Blood pressure (!) 153/103, pulse 78, temperature 99.8 F (37.7 C), resp. rate (!) 22, last menstrual period 08/13/2014, SpO2 98%.  LABS  Admission on 04/24/2024  Component Date Value Ref Range Status   Sodium 04/24/2024 138  135 - 145 mmol/L Final   Potassium 04/24/2024 3.9  3.5 - 5.1 mmol/L Final   Chloride 04/24/2024 107  98 - 111 mmol/L Final   CO2 04/24/2024 20 (L)  22 - 32 mmol/L Final   Glucose, Bld 04/24/2024 140 (H)  70 - 99 mg/dL Final   Glucose reference range applies only to samples taken after fasting for at least 8 hours.   BUN 04/24/2024 15  6 - 20 mg/dL Final   Creatinine, Ser 04/24/2024 0.63  0.44 - 1.00 mg/dL Final   Calcium 91/87/7974 9.2  8.9 - 10.3 mg/dL Final   Total Protein 91/87/7974 8.1  6.5 - 8.1 g/dL Final   Albumin 91/87/7974 4.1  3.5 - 5.0 g/dL Final   AST 91/87/7974 26  15 - 41 U/L Final   ALT 04/24/2024 19  0 - 44 U/L Final   Alkaline Phosphatase 04/24/2024 98  38 - 126 U/L Final   Total Bilirubin 04/24/2024 1.0  0.0 - 1.2 mg/dL Final   GFR, Estimated 04/24/2024 >60  >60 mL/min Final   Comment: (NOTE) Calculated using the CKD-EPI Creatinine Equation (2021)    Anion gap 04/24/2024 11  5 - 15 Final   Performed at [facility], [address] [place], [place] [address]   Alcohol, Ethyl (B) 04/24/2024 <15  <15 mg/dL Final   Comment: (NOTE) For medical purposes only. Performed at [facility], [address], [place], [place] [address]    WBC 04/24/2024 7.1  4.0 - 10.5 K/uL Final   RBC 04/24/2024 5.14 (H)  3.87 - 5.11 MIL/uL Final   Hemoglobin 04/24/2024 13.6  12.0 - 15.0 g/dL Final   HCT 91/87/7974 43.2  36.0 - 46.0 %  Final   MCV 04/24/2024 84.0  80.0 - 100.0 fL Final   MCH 04/24/2024 26.5  26.0 - 34.0 pg Final   MCHC 04/24/2024 31.5  30.0 - 36.0 g/dL Final   RDW 91/87/7974 14.6  11.5 - 15.5 % Final   Platelets 04/24/2024 204  150 - 400 K/uL Final   nRBC 04/24/2024 0.0  0.0 -  0.2 % Final   Performed at [facility], [address] [place], [place] [address]   Opiates 04/24/2024 NONE DETECTED  NONE DETECTED Final   Cocaine 04/24/2024 NONE DETECTED  NONE DETECTED Final   Benzodiazepines 04/24/2024 NONE DETECTED  NONE DETECTED Final   Amphetamines 04/24/2024 NONE DETECTED  NONE DETECTED Final   Tetrahydrocannabinol 04/24/2024 NONE DETECTED  NONE DETECTED Final   Barbiturates 04/24/2024 NONE DETECTED  NONE DETECTED Final   Comment: (NOTE) DRUG SCREEN FOR MEDICAL PURPOSES ONLY.  IF CONFIRMATION IS NEEDED FOR ANY PURPOSE, NOTIFY LAB WITHIN 5 DAYS.  LOWEST DETECTABLE LIMITS FOR URINE DRUG SCREEN Drug Class                     Cutoff (ng/mL) Amphetamine and metabolites    1000 Barbiturate and metabolites    200 Benzodiazepine                 200 Opiates and metabolites        300 Cocaine and metabolites        300 THC                            50 Performed at [facility], [address] [place], [place] [address]     PSYCHIATRIC REVIEW OF SYSTEMS (ROS)  ROS: Notable for the following relevant positive findings: ROS  Additional findings:      Musculoskeletal: No abnormal movements observed      Gait & Station: Normal      Pain Screening: Denies      Nutrition & Dental Concerns: none  RISK FORMULATION/ASSESSMENT  Is the patient experiencing any suicidal or homicidal ideations: No       Explain if yes:     Protective factors considered for safety management: Hydroxyzine  25 mg p.o. 3 times daily as needed for anxiety  Risk factors/concerns considered for safety management:  Physical illness/chronic pain  Is there a safety management plan with the  patient and treatment team to minimize risk factors and promote protective factors: Yes           Explain: Outpatient management Is crisis care placement or psychiatric hospitalization recommended: No     Based on my current evaluation and risk assessment, patient is determined at this time to be at:  Low risk  *RISK ASSESSMENT Risk assessment is a dynamic process; it is possible that this patient's condition, and risk level, may change. This should be re-evaluated and managed over time as appropriate. Please re-consult psychiatric consult services if additional assistance is needed in terms of risk assessment and management. If your team decides to discharge this patient, please advise the patient how to best access emergency psychiatric services, or to call 911, if their condition worsens or they feel unsafe in any way.   Kourtlyn Charlet D Kenlynn Houde, MD Telepsychiatry Consult Services

## 2024-04-25 NOTE — Group Note (Deleted)
 Date:  04/25/2024 Time:  2:22 PM  Group Topic/Focus:  Wellness Toolbox:   The focus of this group is to discuss various aspects of wellness, balancing those aspects and exploring ways to increase the ability to experience wellness.  Patients will create a wellness toolbox for use upon discharge.     Participation Level:  {BHH PARTICIPATION OZCZO:77735}  Participation Quality:  {BHH PARTICIPATION QUALITY:22265}  Affect:  {BHH AFFECT:22266}  Cognitive:  {BHH COGNITIVE:22267}  Insight: {BHH Insight2:20797}  Engagement in Group:  {BHH ENGAGEMENT IN HMNLE:77731}  Modes of Intervention:  {BHH MODES OF INTERVENTION:22269}  Additional Comments:  ***  Myra Curtistine BROCKS 04/25/2024, 2:22 PM

## 2024-04-25 NOTE — ED Provider Notes (Addendum)
 Emergency Medicine Observation Re-evaluation Note  Kristin [name] is a 53 y.o. female, seen on rounds today.  Patient presented for an anxiety attack.  Patient is waiting for psychiatric evaluation Physical Exam  BP (!) 153/103   Pulse 78   Temp 99.8 F (37.7 C)   Resp (!) 22   LMP 08/13/2014   SpO2 98%  Physical Exam General: resting   ED Course / MDM  EKG:EKG Interpretation Date/Time:  Tuesday April 24 2024 20:10:30 EDT Ventricular Rate:  77 PR Interval:  187 QRS Duration:  93 QT Interval:  378 QTC Calculation: 428 R Axis:   -30  Text Interpretation: Sinus rhythm Left axis deviation RSR' in V1 or V2, right VCD or RVH Minimal ST elevation, anterior leads No significant change since last tracing Confirmed by Randol Simmonds [phone]) on 04/25/2024 7:33:38 AM  I have reviewed the labs performed to date as well as medications administered while in observation.  Recent changes in the last 24 hours include no acute events.  Plan  Psychiatric evaluation is pending.  Disposition plan per psychiatric evaluation    Randol Simmonds, MD 04/25/24 [phone]  Patient was evaluated by Dr. Kodjo via [facility] telepsychiatry consult.  Recommendation is for outpatient management.  Patient felt to be low risk for suicide.  Recommendation is for hydroxyzine  and outpatient follow-up   Randol Simmonds, MD 04/25/24 1301

## 2024-04-25 NOTE — ED Notes (Signed)
 Belongings returned to pt

## 2024-04-25 NOTE — Consult Note (Addendum)
 [facility] Psychiatric Consult Initial  Patient Name: .Kristin [name]  MRN: [id number]  DOB: 07-Feb-1971  Consult Order details:  Orders (From admission, onward)     Start     Ordered   04/24/24 2218  CONSULT TO CALL ACT TEAM       Ordering Provider: Francis Ileana SAILOR, PA-C  Provider:  (Not yet assigned)  Question:  Reason for Consult?  Answer:  Psych consult   04/24/24 2217             Mode of Visit: In person    Psychiatry Consult Evaluation  Service Date: April 25, 2024 LOS:  LOS: 0 days  Chief Complaint My heart was hurting.  Primary Psychiatric Diagnoses  Suicidal Ideation   Assessment  Kristin [name] is a 53 y.o. female admitted: Presented to the EDfor 04/24/2024  7:56 PM for brought in by EMS from home for an anxiety attack. She carries the psychiatric diagnoses of none and has a past medical history of ileus, right hydrosalpinx, type 2 diabetes, PID, anemia and constipation.   Her current presentation of wanting to kill herself is most consistent with suicidal ideation. She meets criteria for inpatient psychiatric hospitalization based on being a danger to herself and others.  Current outpatient psychotropic medications include none and historically she has had a N/A response to these medications. She was compliant with medical medications prior to admission as evidenced by patient report. On initial examination, patient is cooperative, calm and matter of fact. Please see plan below for detailed recommendations.   Diagnoses:  Active Hospital problems: Principal Problem:   Suicidal ideation    Plan   ## Psychiatric Medication Recommendations:  --hydroxyzine  25mg  PO TID PRN anxiety  ## Medical Decision Making Capacity: Not specifically addressed in this encounter  ## Further Work-up:  -- most recent EKG on 04/24/2024 had QtC of 428 -- Pertinent labwork reviewed earlier this admission includes: CBC, CMP, alcohol and UDS   ## Disposition:-- We recommend inpatient  psychiatric hospitalization when medically cleared. Patient is under voluntary admission status at this time; please IVC if attempts to leave hospital.  ## Behavioral / Environmental: - No specific recommendations at this time.     ## Safety and Observation Level:  - Based on my clinical evaluation, I estimate the patient to be at high risk of self harm in the current setting. - At this time, we recommend  1:1 Observation. This decision is based on my review of the chart including patient's history and current presentation, interview of the patient, mental status examination, and consideration of suicide risk including evaluating suicidal ideation, plan, intent, suicidal or self-harm behaviors, risk factors, and protective factors. This judgment is based on our ability to directly address suicide risk, implement suicide prevention strategies, and develop a safety plan while the patient is in the clinical setting. Please contact our team if there is a concern that risk level has changed.  CSSR Risk Category:C-SSRS RISK CATEGORY: Low Risk  Suicide Risk Assessment: Patient has following modifiable risk factors for suicide: active suicidal ideation and triggering events, which we are addressing by recommending inpatient psychiatric hospitalization. Patient has following non-modifiable or demographic risk factors for suicide: none Patient has the following protective factors against suicide: Supportive family and Minor children in the home  Thank you for this consult request. Recommendations have been communicated to the primary team.  We will continue to follow at this time.   [name] Patient, NP       History  of Present Illness  Relevant Aspects of Hospital ED Course:  Admitted on 04/24/2024 for brought in by EMS from home for an anxiety attack. She carries the psychiatric diagnoses of none and has a past medical history of ileus, right hydrosalpinx, type 2 diabetes, PID, anemia and constipation.    Patient Report:  Kristin [name], is seen face to face by this provider, consulted with Dr. Larina; and chart reviewed on 04/25/24.  On evaluation [name] reports she came into the hospital because her heart was hurting. Patient is tearful and her eyes are red from crying. Patients brother is at the bedside during the assessment and patient asks that he be allowed to stay.  Patient says she lives with her husband and youngest daughter.  She has five children total.    Patient reports that last night her and her husband were talking when her heart started to hurt and an ambulance was called.  Patient confirms that after arrival at the hospital last night, she told staff she was experiencing suicidal and homicidal ideation.  She reiterated that she continues having thoughts of killing her husband and killing herself.  When asked about a plan, patient would only say maybe and looked away.  Patient eventually reports that her husband is cheating; this is the event that has triggered these feelings.  Patient denies any history of depression and says she has not previously experienced these feelings.  Patient's brother confirmed patient has no history and there is no family history of mental health concerns.    During evaluation Kristin [name] is laying on a stretcher tearful with red rimmed eyes.  She is alert & oriented x 4, calm, cooperative and attentive for this assessment.  Her mood is angry and hurt with congruent affect.  She has normal speech with a thick accent, and normal behavior.  Objectively there is no evidence of psychosis/mania or delusional thinking. Pt does not appear to be responding to internal or external stimuli.  Patient is able to converse coherently, goal directed thoughts, no distractibility, or pre-occupation.  She denies psychosis, and paranoia; she endorses suicidal/self-harm/homicidal ideation.  Patient answered questions appropriately.    I personally spent a total of 60 minutes in  the care of the patient today including preparing to see the patient, getting/reviewing separately obtained history, performing a medically appropriate exam/evaluation, counseling and educating, placing orders, referring and communicating with other health care professionals, documenting clinical information in the EHR, independently interpreting results, and coordinating care.  Psych ROS:  Depression: denies Anxiety:  denies Mania (lifetime and current): denies Psychosis: (lifetime and current): denies  Collateral information:  Contacted Ava Pagoda, spouse, at [phone] on 04/25/2024 Called and left a HIPAA compliant message.  Review of Systems  Eyes:  Positive for redness.  Psychiatric/Behavioral:  Positive for suicidal ideas.   All other systems reviewed and are negative.    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 rate (!) 22, last menstrual period 08/13/2014, SpO2 98%. There is no  height or weight on file to calculate BMI.  Physical Exam Vitals and nursing note reviewed.  Eyes:     Pupils: Pupils are equal, round, and reactive to light.  Pulmonary:     Effort: Pulmonary effort is normal.  Skin:    General: Skin is dry.  Neurological:     Mental Status: She is alert and oriented to person, place, and time.  Psychiatric:        Attention and Perception: Attention and perception normal.        Mood and Affect: Affect is tearful.        Speech: Speech normal.        Behavior: Behavior normal. Behavior is cooperative.        Thought Content: Thought content includes homicidal and suicidal ideation. Thought content includes homicidal and suicidal plan.        Cognition and Memory: Cognition and memory normal.     Mental Status Exam: General Appearance: Casual  Orientation:  Full (Time, Place, and Person)  Memory:  Immediate;   Good Recent;   Good Remote;   Good  Concentration:  Concentration: Good  Recall:  Good  Attention  Good  Eye Contact:  Fair  Speech:  Clear and Coherent  Language:  Fair  Volume:  Normal  Mood: angry and hurt  Affect:  Congruent  Thought Process:  Coherent  Thought Content:  Logical  Suicidal Thoughts:  Yes.  with intent/plan  Homicidal Thoughts:  Yes.  with intent/plan  Judgement:  Impaired  Insight:  Fair  Psychomotor Activity:  Normal  Akathisia:  No  Fund of Knowledge:  Fair      Assets:  Engineer, maintenance Social Support  Cognition:  WNL  ADL's:  Intact  AIMS (if indicated):        Other History   These have been pulled in through the EMR, reviewed, and updated if appropriate.  Family History:  The patient's family history includes Diabetes in her father; Hypertension in her father.  Medical History: Past Medical History:  Diagnosis Date   Anemia    Gestational diabetes    PID (pelvic inflammatory disease)    Pregnancy induced hypertension     Surgical History: Past Surgical History:   Procedure Laterality Date   ABDOMINAL HYSTERECTOMY N/A 08/20/2014   Procedure: HYSTERECTOMY ABDOMINAL, Bilateral Salpingo oophrectomy, Hemostasis of Vaginal Cuff;  Surgeon: Elveria Mungo, MD;  Location: [facility];  Service: Gynecology;  Laterality: N/A;   ABDOMINAL HYSTERECTOMY     NO PAST SURGERIES       Medications:  No current facility-administered medications for this encounter.  Current Outpatient Medications:    metFORMIN (GLUCOPHAGE) 500 MG tablet, Take 500 mg by mouth., Disp: , Rfl:    cetirizine (ZYRTEC) 10 MG tablet, Take 10 mg by mouth daily. Patient states she ran out today, but plans to refill, Disp: , Rfl:    cyclobenzaprine  (FLEXERIL ) 10 MG tablet, Take 1 tablet (10 mg total) by mouth at bedtime. (Patient not taking: Reported on 08/22/2015), Disp: 15 tablet,  Rfl: 0   docusate sodium  (COLACE) 100 MG capsule, Take 1 capsule (100 mg total) by mouth 2 (two) times daily as needed. (Patient not taking: Reported on 08/22/2015), Disp: 30 capsule, Rfl: 2   estradiol  (ESTRACE ) 0.5 MG tablet, Take 1 tablet (0.5 mg total) by mouth daily., Disp: 31 tablet, Rfl: 12   estrogens , conjugated, (PREMARIN ) 0.625 MG tablet, Take 1 tablet (0.625 mg total) by mouth daily. Take daily for 21 days then do not take for 7 days. (Patient not taking: Reported on 08/22/2015), Disp: 30 tablet, Rfl: 3   ferrous gluconate  (FERGON) 324 MG tablet, 1 tablet 2-3 times daily with orange juice (Patient not taking: Reported on 08/22/2015), Disp: 90 tablet, Rfl: 2   metroNIDAZOLE  (FLAGYL ) 500 MG tablet, Take 1 tablet (500 mg total) by mouth 2 (two) times daily. No alcohol while taking this medication, Disp: 14 tablet, Rfl: 0   naproxen  (NAPROSYN ) 375 MG tablet, Take 1 tablet (375 mg total) by mouth 2 (two) times daily with a meal. (Patient not taking: Reported on 08/22/2015), Disp: 30 tablet, Rfl: 0  Allergies: Allergies  Allergen Reactions   Zosyn  [Piperacillin  Sod-Tazobactam So] Itching    Pt reports itching when  receiving zosyn     Winola Drum A Shirlie Enck, NP
